# Patient Record
Sex: Female | Born: 1970 | Race: White | Hispanic: No | Marital: Married | Smoking: Former smoker
Health system: Southern US, Community
[De-identification: ages and names within clinical notes are randomized; demographics above are authoritative.]

## PROBLEM LIST (undated history)

## (undated) DIAGNOSIS — R002 Palpitations: Secondary | ICD-10-CM

## (undated) DIAGNOSIS — I10 Essential (primary) hypertension: Secondary | ICD-10-CM

## (undated) DIAGNOSIS — I77810 Thoracic aortic ectasia: Secondary | ICD-10-CM

## (undated) HISTORY — DX: Palpitations: R00.2

## (undated) HISTORY — DX: Thoracic aortic ectasia: I77.810

## (undated) HISTORY — PX: TUBAL LIGATION: SHX77

## (undated) HISTORY — DX: Essential (primary) hypertension: I10

---

## 2015-07-29 ENCOUNTER — Other Ambulatory Visit: Payer: Self-pay | Admitting: Osteopathic Medicine

## 2015-07-29 ENCOUNTER — Encounter: Payer: Self-pay | Admitting: Osteopathic Medicine

## 2015-07-29 ENCOUNTER — Ambulatory Visit (INDEPENDENT_AMBULATORY_CARE_PROVIDER_SITE_OTHER): Payer: Managed Care, Other (non HMO) | Admitting: Osteopathic Medicine

## 2015-07-29 VITALS — BP 125/85 | HR 77 | Ht 63.0 in | Wt 157.0 lb

## 2015-07-29 DIAGNOSIS — I10 Essential (primary) hypertension: Secondary | ICD-10-CM

## 2015-07-29 DIAGNOSIS — Z98891 History of uterine scar from previous surgery: Secondary | ICD-10-CM | POA: Insufficient documentation

## 2015-07-29 DIAGNOSIS — R7301 Impaired fasting glucose: Secondary | ICD-10-CM

## 2015-07-29 DIAGNOSIS — R002 Palpitations: Secondary | ICD-10-CM | POA: Diagnosis not present

## 2015-07-29 DIAGNOSIS — Z87891 Personal history of nicotine dependence: Secondary | ICD-10-CM

## 2015-07-29 DIAGNOSIS — R5382 Chronic fatigue, unspecified: Secondary | ICD-10-CM

## 2015-07-29 DIAGNOSIS — Z9851 Tubal ligation status: Secondary | ICD-10-CM | POA: Insufficient documentation

## 2015-07-29 MED ORDER — AMLODIPINE BESYLATE 5 MG PO TABS: 7.5000 mg | ORAL_TABLET | Freq: Every day | ORAL | Status: DC

## 2015-07-29 NOTE — Patient Instructions (Addendum)
We ask our patient with high blood pressure to follow-up every 6 months to monitor your blood pressure and manage your prescriptions. Please don't hesitate to make an appointment sooner if you're having any acute concerns or problems! Please bring your home blood pressure monitor to your next visit so we can verify it with our equipment in the office to ensure your home measurements are accurate. Please let your pharmacy know when you are running low on medications/refills (do not wait until you are out of medicines). Your pharmacy will send our office a request for the appropriate medications. Please allow our office 2-3 business days to process the needed refills.   At any visits to any specialists, or if you receive any vaccines anywhere other than our office, please give them our clinic information so that they can forward us any records, including any tests which are done or changes to your medications. This allows all your physicians to communicate effectively, putting your primary care doctor at the center of your medical care and allowing us to effectively coordinate your care.   Depending on lab results and on your palpitation symptoms, we will want to consider further tests such as Holter monitoring or Echocardiogram (ultrasound of the heart), and we may ask you to come in for an office visit to discuss significant results or to further talk about other causes of palpitations such as anxiety. Please alert us right away to any concerns you may have or if there are any changes with your symptoms, and please seek care immediately for any of the symptoms highlighted below! If you do not hear back about your lab results 1 - 2 days after you have your blood drawn, please call our office!   Let's plan to follow-up here in the office in 6 months to recheck blood pressure and to dedicate time to your annual wellness/preventive care visit.   Please let us know if there is anything else we can do for you.  Take care! -Dr. Mervyn SkeetersA.     Palpitations A palpitation is the feeling that your heartbeat is irregular or is faster than normal. It may feel like your heart is fluttering or skipping a beat. Palpitations are usually not a serious problem. However, in some cases, you may need further medical evaluation. CAUSES  Palpitations can be caused by:  Smoking.  Caffeine or other stimulants, such as diet pills or energy drinks.  Alcohol.  Stress and anxiety.  Strenuous physical activity.  Fatigue.  Certain medicines.  Heart disease, especially if you have a history of irregular heart rhythms (arrhythmias), such as atrial fibrillation, atrial flutter, or supraventricular tachycardia.  An improperly working pacemaker or defibrillator. DIAGNOSIS  To find the cause of your palpitations, your health care provider will take your medical history and perform a physical exam. Your health care provider may also have you take a test called an ambulatory electrocardiogram (ECG). An ECG records your heartbeat patterns over a 24-hour period. You may also have other tests, such as:  Transthoracic echocardiogram (TTE). During echocardiography, sound waves are used to evaluate how blood flows through your heart.  Transesophageal echocardiogram (TEE).  Cardiac monitoring. This allows your health care provider to monitor your heart rate and rhythm in real time.  Holter monitor. This is a portable device that records your heartbeat and can help diagnose heart arrhythmias. It allows your health care provider to track your heart activity for several days, if needed.  Stress tests by exercise or by giving medicine  that makes the heart beat faster. TREATMENT  Treatment of palpitations depends on the cause of your symptoms and can vary greatly. Most cases of palpitations do not require any treatment other than time, relaxation, and monitoring your symptoms. Other causes, such as atrial fibrillation, atrial flutter,  or supraventricular tachycardia, usually require further treatment. HOME CARE INSTRUCTIONS   Avoid:  Caffeinated coffee, tea, soft drinks, diet pills, and energy drinks.  Chocolate.  Alcohol.  Stop smoking if you smoke.  Reduce your stress and anxiety. Things that can help you relax include:  A method of controlling things in your body, such as your heartbeats, with your mind (biofeedback).  Yoga.  Meditation.  Physical activity such as swimming, jogging, or walking.  Get plenty of rest and sleep. SEEK MEDICAL CARE IF:   You continue to have a fast or irregular heartbeat beyond 24 hours.  Your palpitations occur more often. SEEK IMMEDIATE MEDICAL CARE IF:  You have chest pain or shortness of breath.  You have a severe headache.  You feel dizzy or you faint. MAKE SURE YOU:  Understand these instructions.  Will watch your condition.  Will get help right away if you are not doing well or get worse.   This information is not intended to replace advice given to you by your health care provider. Make sure you discuss any questions you have with your health care provider.   Document Released: 02/12/2000 Document Revised: 02/19/2013 Document Reviewed: 04/15/2011 Elsevier Interactive Patient Education [employer]

## 2015-07-29 NOTE — Progress Notes (Signed)
HPI: Taylor Lloyd is a 45 y.o. female who presents to [facility] today for chief complaint of:  Chief Complaint  Patient presents with  . Establish Care  . Hypertension     HYPERTENSION - Home monitor wrist, about less than a year old. Gx Preeclampsia last pregnancy. No CP/SOB.   PALPITATIONS - Sister with palpitations and hx mitral valve prolapse. She has occasional palpitations, notices more at nighttime, no dizziness/lightheaded, no chest pain/SOB. Concerns for anxiety.   FATIGUE - some trouble sleeping, has 78-year old at home, tired all the time.    Past medical, social and family history reviewed: History reviewed. No pertinent past medical history. History reviewed. No pertinent past surgical history. Social History  Substance Use Topics  . Smoking status: Former Games developer  . Smokeless tobacco: Not on file  . Alcohol Use: Not on file   History reviewed. No pertinent family history.  Current Outpatient Prescriptions  Medication Sig Dispense Refill  . amLODipine (NORVASC) 5 MG tablet Take 5 mg by mouth daily. Patient takes 7.5 mg daily     No current facility-administered medications for this visit.   No Known Allergies    Review of Systems: CONSTITUTIONAL:  No  fever, no chills, No  unintentional weight changes HEAD/EYES/EARS/NOSE/THROAT: No  headache, no vision change, no hearing change, No  sore throat, No  sinus pressure CARDIAC: No  chest pain, No  pressure, (+) palpitations, No  orthopnea RESPIRATORY: No  cough, No  shortness of breath/wheeze GASTROINTESTINAL: No  nausea, No  vomiting, No  abdominal pain, No  blood in stool, No  diarrhea, No  constipation  MUSCULOSKELETAL: No  myalgia/arthralgia GENITOURINARY: No  incontinence, No  abnormal genital bleeding/discharge SKIN: No  rash/wounds/concerning lesions HEM/ONC: No  easy bruising/bleeding, No  abnormal lymph node ENDOCRINE: No polyuria/polydipsia/polyphagia, No  heat/cold  intolerance  NEUROLOGIC: No  weakness, No  dizziness, No  slurred speech PSYCHIATRIC: No  concerns with depression, No  concerns with anxiety, No sleep problems  Exam:  BP 125/85 mmHg  Pulse 77  Ht  (1.6 m)  Wt 157 lb (71.215 kg)  BMI 27.82 kg/m2 Constitutional: VS see above. General Appearance: alert, well-developed, well-nourished, NAD Eyes: Normal lids and conjunctive, non-icteric sclera, PERRLA Ears, Nose, Mouth, Throat: MMM, Normal external inspection ears/nares/mouth/lips/gums, TM normal bilaterally. Pharynx no erythema, no exudate. Normal nasal mucosa Neck: No masses, trachea midline. No thyroid enlargement/tenderness/mass appreciated. No lymphadenopathy Respiratory: Normal respiratory effort. no wheeze, no rhonchi, no rales Cardiovascular: S1/S2 normal, no murmur, no rub/gallop auscultated. RRR. No JVD. No abdominal aortic bruit. No lower extremity edema. Gastrointestinal: Nontender, no masses. No hepatomegaly, no splenomegaly. No hernia appreciated. Bowel sounds normal. Rectal exam deferred.  Musculoskeletal: Gait normal. No clubbing/cyanosis of digits.  Neurological: No cranial nerve deficit on limited exam. Motor and sensation intact and symmetric Skin: warm, dry, intact. No rash/ulcer. No concerning nevi or subq nodules on limited exam.   Psychiatric: Normal judgment/insight. Normal mood and affect. Oriented x3.    No results found for this or any previous visit (from the past 72 hour(s)).  EKG interpretation: Rate: 76 Rhythm: sinus No ST/T changes concerning for acute ischemia/infarct     ASSESSMENT/PLAN: See patient instructions, EKG ok, consider Holter/Echo depending on results. Labs as below to get fasting tomorrow AM.   Essential hypertension - Plan: CBC with Differential/Platelet, COMPLETE METABOLIC PANEL WITH GFR, Lipid panel, amLODipine (NORVASC) 5 MG tablet  Palpitations - Plan: CBC with Differential/Platelet, COMPLETE METABOLIC PANEL WITH  GFR, TSH,  Magnesium, Urinalysis, EKG 12-Lead  S/P C-section  Former smoker  S/P tubal ligation  Chronic fatigue - Plan: CBC with Differential/Platelet, TSH, VITAMIN D 25 Hydroxy (Vit-D Deficiency, Fractures)     All questions were answered. Visit summary with updated medication list and pertinent instructions was printed for patient. ER/RTC precautions were reviewed with the patient. Return in about 6 months (around 01/28/2016), or sooner if needed and depending on results/symptoms, for HYPERTENSION RECHECK Fenton Malling/ANNUAL WELLNESS.

## 2015-07-30 LAB — LIPID PANEL
CHOL/HDL RATIO: 2 ratio (ref <=5.0)
CHOLESTEROL: 137 mg/dL (ref 125–200)
HDL: 68 mg/dL (ref >=46)
LDL CALC: 55 mg/dL (ref <130)
Triglycerides: 72 mg/dL (ref <150)
VLDL: 14 mg/dL (ref <30)

## 2015-07-30 LAB — CBC WITH DIFFERENTIAL/PLATELET
BASOS PCT: 0 %
Basophils Absolute: 0 cells/uL (ref 0–200)
EOS PCT: 4 %
Eosinophils Absolute: 276 cells/uL (ref 15–500)
HCT: 40.4 % (ref 35.0–45.0)
Hemoglobin: 13.7 g/dL (ref 11.7–15.5)
LYMPHS PCT: 32 %
Lymphs Abs: 2208 cells/uL (ref 850–3900)
MCH: 32.8 pg (ref 27.0–33.0)
MCHC: 33.9 g/dL (ref 32.0–36.0)
MCV: 96.7 fL (ref 80.0–100.0)
MONO ABS: 690 {cells}/uL (ref 200–950)
MONOS PCT: 10 %
MPV: 8.9 fL (ref 7.5–12.5)
NEUTROS PCT: 54 %
Neutro Abs: 3726 cells/uL (ref 1500–7800)
PLATELETS: 387 10*3/uL (ref 140–400)
RBC: 4.18 MIL/uL (ref 3.80–5.10)
RDW: 13.3 % (ref 11.0–15.0)
WBC: 6.9 10*3/uL (ref 3.8–10.8)

## 2015-07-30 LAB — COMPLETE METABOLIC PANEL WITH GFR
ALT: 17 U/L (ref 6–29)
AST: 18 U/L (ref 10–35)
Albumin: 4.1 g/dL (ref 3.6–5.1)
Alkaline Phosphatase: 45 U/L (ref 33–115)
BUN: 6 mg/dL — AB (ref 7–25)
CALCIUM: 9.3 mg/dL (ref 8.6–10.2)
CHLORIDE: 106 mmol/L (ref 98–110)
CO2: 23 mmol/L (ref 20–31)
CREATININE: 0.62 mg/dL (ref 0.50–1.10)
GFR, Est African American: >89 mL/min (ref >=60)
GFR, Est Non African American: >89 mL/min (ref >=60)
Glucose, Bld: 101 mg/dL — ABNORMAL HIGH (ref 65–99)
POTASSIUM: 4.3 mmol/L (ref 3.5–5.3)
SODIUM: 137 mmol/L (ref 135–146)
Total Bilirubin: 0.7 mg/dL (ref 0.2–1.2)
Total Protein: 6.4 g/dL (ref 6.1–8.1)

## 2015-07-30 LAB — TSH: TSH: 1.37 mIU/L

## 2015-07-30 LAB — MAGNESIUM: MAGNESIUM: 1.9 mg/dL (ref 1.5–2.5)

## 2015-07-31 LAB — URINALYSIS
BILIRUBIN URINE: NEGATIVE
GLUCOSE, UA: NEGATIVE
HGB URINE DIPSTICK: NEGATIVE
Ketones, ur: NEGATIVE
LEUKOCYTES UA: NEGATIVE
Nitrite: NEGATIVE
PH: 7 (ref 5.0–8.0)
Protein, ur: NEGATIVE
Specific Gravity, Urine: 1.013 (ref 1.001–1.035)

## 2015-07-31 LAB — HEMOGLOBIN A1C
HEMOGLOBIN A1C: 5.2 % (ref <5.7)
Mean Plasma Glucose: 103 mg/dL

## 2015-07-31 LAB — VITAMIN D 25 HYDROXY (VIT D DEFICIENCY, FRACTURES): VIT D 25 HYDROXY: 36 ng/mL (ref 30–100)

## 2015-07-31 NOTE — Addendum Note (Signed)
Addended by: Deirdre PippinsALEXANDER, Castor Gittleman M on: 07/31/2015 08:28 AM   Modules accepted: Orders

## 2015-08-03 NOTE — Addendum Note (Signed)
Addended by: Deirdre PippinsALEXANDER, Karalina Tift M on: 08/03/2015 09:51 AM   Modules accepted: Orders

## 2016-01-27 ENCOUNTER — Ambulatory Visit (INDEPENDENT_AMBULATORY_CARE_PROVIDER_SITE_OTHER): Payer: Managed Care, Other (non HMO) | Admitting: Osteopathic Medicine

## 2016-01-27 ENCOUNTER — Encounter: Payer: Self-pay | Admitting: Osteopathic Medicine

## 2016-01-27 VITALS — BP 144/98 | HR 72 | Ht 63.0 in | Wt 166.0 lb

## 2016-01-27 DIAGNOSIS — R002 Palpitations: Secondary | ICD-10-CM

## 2016-01-27 DIAGNOSIS — I10 Essential (primary) hypertension: Secondary | ICD-10-CM

## 2016-01-27 MED ORDER — AMLODIPINE BESYLATE 5 MG PO TABS: 7.5000 mg | ORAL_TABLET | Freq: Every day | ORAL | 3 refills | Status: DC

## 2016-01-27 NOTE — Patient Instructions (Addendum)
Please call if you haven't heard back about Holter, Echocardiogram and cardiology referrals.

## 2016-01-27 NOTE — Progress Notes (Signed)
HPI: Taylor Lloyd is a 45 y.o. female  who [facility] [place] [facility], 01/27/16,  for chief complaint of:  Chief Complaint  Patient presents with  . Follow-up    blood pressure    Hypertension: No chest pain, pressure, shortness of breath.  Palpitations: Have persisted on and off, we had planned to get echocardiogram/Holter monitor, patient did not call us when she never heard back about scheduling these procedures. Still having heart racing episodes lasting a few minutes several times per week. States previous evaluation by cardiology but not sure if it was for palpitations, felt like she had a stress test as well as echocardiogram, EKG. No records are available  Past medical, surgical, social and family history reviewed: Patient Active Problem List   Diagnosis Date Noted  . Essential hypertension 07/29/2015  . S/P C-section 07/29/2015  . Former smoker 07/29/2015  . S/P tubal ligation 07/29/2015   Past Surgical History:  Procedure Laterality Date  . CESAREAN SECTION     [phone],1997,209,2015   Social History  Substance Use Topics  . Smoking status: Former Games developermoker  . Smokeless tobacco: Not on file  . Alcohol use Not on file   Family History  Problem Relation Age of Onset  . Hypertension Mother   . Cancer Father     lung  . Hyperlipidemia Father   . Hypertension Father   . Diabetes Maternal Grandmother   .        Current medication list and allergy/intolerance information reviewed:   Current Outpatient Prescriptions on File Prior to Visit  Medication Sig Dispense Refill  . amLODipine (NORVASC) 5 MG tablet Take 1.5 tablets (7.5 mg total) by mouth daily. Patient takes 7.5 mg daily 45 tablet 6   No current facility-administered medications on file prior to visit.    Allergies  Allergen Reactions  . Motrin [Ibuprofen] Hives      Review of Systems:  Constitutional: No recent illness  HEENT: No  headache, no vision  change  Cardiac: No  chest pain, No  pressure, +palpitations  Respiratory:  No  shortness of breath. No  Cough  Neurologic: No  weakness, No  Dizziness  Psychiatric: No  concerns with depression, No  concerns with anxiety  Exam:  BP (!) 144/98   Pulse 72   Ht 5\' 3"  (1.6 m)   Wt 166 lb (75.3 kg)   BMI 29.41 kg/m   Constitutional: VS see above. General Appearance: alert, well-developed, well-nourished, NAD  Eyes: Normal lids and conjunctive, non-icteric sclera  Ears, Nose, Mouth, Throat: MMM, Normal external inspection ears/nares/mouth/lips/gums.  Neck: No masses, trachea midline.   Respiratory: Normal respiratory effort. no wheeze, no rhonchi, no rales  Cardiovascular: S1/S2 normal, no murmur, no rub/gallop auscultated. RRR.   Musculoskeletal: Gait normal. Symmetric and independent movement of all extremities  Neurological: Normal balance/coordination. No tremor.  Skin: warm, dry, intact.   Psychiatric: Normal judgment/insight. Normal mood and affect. Oriented x3.      ASSESSMENT/PLAN:   Palpitations - Plan: Holter monitor - 48 hour, ECHOCARDIOGRAM COMPLETE  Essential hypertension - Plan: amLODipine (NORVASC) 5 MG tablet  Annual physical exam   FEMALE PREVENTIVE CARE Updated 01/27/16   ANNUAL SCREENING/COUNSELING  Diet/Exercise - HEALTHY HABITS DISCUSSED TO DECREASE CV RISK History  Smoking Status  . Former Smoker  Smokeless Tobacco  . Not on file    History  Alcohol use Not on file    [facility] 01/28/2016  Decreased Interest 0  Down, Depressed, Hopeless 0  PHQ - 2 Score 0    Domestic violence concerns - no  HTN SCREENING - SEE VITALS  SEXUAL HEALTH  Sexually active in the past year - Yes with female.  Need/want STI testing today? - no  Concerns about libido or pain with sex? - no  Plans for pregnancy? - btl  INFECTIOUS DISEASE SCREENING  HIV - does not need  GC/CT - does not need  HepC - DOB [birth year] - does not  need  TB - does not need  DISEASE SCREENING  Lipid - does not need  DM2 - does not need  Osteoporosis - women age 1+ - does not need  CANCER SCREENING  Cervical - does not need  Breast - does not need  Lung - does not need  Colon - does not need  ADULT VACCINATION  Influenza - annual vaccine recommended  Td - booster every 10 years   Zoster - option at 50, yes at 60+   PCV13 - was not indicated  PPSV23 - was not indicated  There is no immunization history on file for this patient.   OTHER  Fall - exercise and Vit D age 1+ - does not need  Consider ASA - age 46-59 - does not need     Patient Instructions  Please call if you haven't heard back about Holter, Echocardiogram and cardiology referrals.       Visit summary with medication list and pertinent instructions WAS printed for patient to review. All questions at time of visit were answered - patient instructed to contact office with any additional concerns. ER/RTC precautions were reviewed with the patient. Follow-up plan: Return in about 3 months (around 04/27/2016) for recheck BP.

## 2016-01-28 DIAGNOSIS — R002 Palpitations: Secondary | ICD-10-CM | POA: Insufficient documentation

## 2016-02-01 NOTE — Progress Notes (Signed)
Referring: Sunnie NielsenAlexander, Natalie   HPI: 45 year old female for evaluation of palpitations. TSH May 2017 1.37. Patient has had intermittent palpitations for approximately 1-1/2 years. They are described as her heart racing. It typically occurs at night. No associated dizziness, syncope, chest pain or dyspnea. Resolves spontaneously. She has mild dyspnea on exertion but no orthopnea or PND. She does have pedal edema. There is no significant exertional chest pain. Because of the above we were asked to evaluate.  Current Outpatient Prescriptions  Medication Sig Dispense Refill  . amLODipine (NORVASC) 5 MG tablet Take 1.5 tablets (7.5 mg total) by mouth daily. Patient takes 7.5 mg daily 135 tablet 3  . Multiple Vitamin (MULTIVITAMIN) tablet Take 1 tablet by mouth daily.     No current facility-administered medications for this visit.     Allergies  Allergen Reactions  . Motrin [Ibuprofen] Hives     Past Medical History:  Diagnosis Date  . Hypertension   . Palpitations     Past Surgical History:  Procedure Laterality Date  . CESAREAN SECTION     [phone],1997,209,2015  . TUBAL LIGATION      Social History   Social History  . Marital status: Married    Spouse name: N/A  . Number of children: 7  . Years of education: N/A   Occupational History  . Not on file.   Social History Main Topics  . Smoking status: Former Games developermoker  . Smokeless tobacco: Never Used  . Alcohol use 0.0 oz/week     Comment: 2 glasses wine per week  . Drug use: Unknown  . Sexual activity: Not on file   Other Topics Concern  . Not on file   Social History Narrative  . No narrative on file    Family History  Problem Relation Age of Onset  . Hypertension Mother   . Cancer Father     lung  . Hyperlipidemia Father   . Hypertension Father   . CAD Father   . Diabetes Maternal Grandmother   . CAD Brother     ROS: no fevers or chills, productive cough, hemoptysis, dysphasia, odynophagia, melena,  hematochezia, dysuria, hematuria, rash, seizure activity, orthopnea, PND, claudication. Remaining systems are negative.  Physical Exam:   Blood pressure 132/81, pulse 74, height 5\' 3"  (1.6 m), weight 166 lb (75.3 kg).  General:  Well developed/well nourished in NAD Skin warm/dry Patient not depressed No peripheral clubbing Back-normal HEENT-normal/normal eyelids Neck supple/normal carotid upstroke bilaterally; no bruits; no JVD; no thyromegaly chest - CTA/ normal expansion CV - RRR/normal S1 and S2; no rubs or gallops;  PMI nondisplaced; 1/6 systolic murmur apex. Abdomen -NT/ND, no HSM, no mass, + bowel sounds, no bruit 2+ femoral pulses, no bruits Ext-no edema, chords, 2+ DP Neuro-grossly nonfocal  ECG -Sinus rhythm at a rate of 74. No ST changes.  A/P  1 palpitations-etiology unclear. Schedule echocardiogram to assess LV function. We discussed a monitored today but she is allergic to the leads. I therefore recommended [employer]. We will also add a beta blocker.  2 hypertension-she does have some pedal edema with amlodipine. I will discontinue this and treat her high blood pressure and palpitations with beta-blockade. Begin Toprol 50 mg daily.  3 tobacco abuse-agent counseled on discontinuing.  4 family history of coronary disease-She has a brother who was diagnosed with a proximal LAD lesion by calcium score. She is concerned about her family history and has requested this. We will arrange a calcium score.  Olga MillersBrian Crenshaw, MD

## 2016-02-03 ENCOUNTER — Encounter: Payer: Self-pay | Admitting: Cardiology

## 2016-02-03 ENCOUNTER — Ambulatory Visit (INDEPENDENT_AMBULATORY_CARE_PROVIDER_SITE_OTHER): Payer: Managed Care, Other (non HMO) | Admitting: Cardiology

## 2016-02-03 VITALS — BP 132/81 | HR 74 | Ht 63.0 in | Wt 166.0 lb

## 2016-02-03 DIAGNOSIS — R002 Palpitations: Secondary | ICD-10-CM

## 2016-02-03 DIAGNOSIS — R072 Precordial pain: Secondary | ICD-10-CM | POA: Diagnosis not present

## 2016-02-03 DIAGNOSIS — I10 Essential (primary) hypertension: Secondary | ICD-10-CM

## 2016-02-03 MED ORDER — METOPROLOL SUCCINATE ER 25 MG PO TB24: 25.0000 mg | ORAL_TABLET | Freq: Every day | ORAL | 3 refills | Status: DC

## 2016-02-03 NOTE — Patient Instructions (Signed)
Medication Instructions:   STOP AMLODIPINE  START METOPROLOL SUCC ER 50 MG ONCE DAILY AT BEDTIME  Testing/Procedures:  CT CARDIAC CALCIUM SCORE AT THE [place] OFFICE  Follow-Up:  Your physician recommends that you schedule a follow-up appointment in: 8-12 WEEKS WITH DR Jens SomRENSHAW   ALIVECOR TO EVALUATE HEART RHYTHM ON YOUR PHONE

## 2016-02-04 ENCOUNTER — Telehealth: Payer: Self-pay | Admitting: Cardiology

## 2016-02-04 ENCOUNTER — Ambulatory Visit (HOSPITAL_BASED_OUTPATIENT_CLINIC_OR_DEPARTMENT_OTHER): Payer: Managed Care, Other (non HMO)

## 2016-02-04 ENCOUNTER — Ambulatory Visit (HOSPITAL_BASED_OUTPATIENT_CLINIC_OR_DEPARTMENT_OTHER)
Admission: RE | Admit: 2016-02-04 | Discharge: 2016-02-04 | Disposition: A | Discharge status: Home / Self Care | Payer: Managed Care, Other (non HMO) | Source: Ambulatory Visit | Attending: Osteopathic Medicine | Admitting: Osteopathic Medicine

## 2016-02-04 DIAGNOSIS — R002 Palpitations: Secondary | ICD-10-CM | POA: Diagnosis not present

## 2016-02-04 NOTE — Progress Notes (Signed)
  Echocardiogram 2D Echocardiogram has been performed.  Taylor SavoyCasey N Margaret Lloyd 02/04/2016, 10:13 AM

## 2016-02-04 NOTE — Telephone Encounter (Signed)
New message  Pt call requesting to speak with RN about Dr. Jens Somrenshaw reviewing her echo results and going over them with pt. Please call back to discuss

## 2016-02-04 NOTE — Telephone Encounter (Signed)
Pt notified of result turnaround time pt will await MD reading and call for results

## 2016-02-11 ENCOUNTER — Ambulatory Visit (INDEPENDENT_AMBULATORY_CARE_PROVIDER_SITE_OTHER)
Admission: RE | Admit: 2016-02-11 | Discharge: 2016-02-11 | Disposition: A | Discharge status: Home / Self Care | Payer: Self-pay | Source: Ambulatory Visit | Attending: Cardiology | Admitting: Cardiology

## 2016-02-11 DIAGNOSIS — R072 Precordial pain: Secondary | ICD-10-CM

## 2016-02-11 IMAGING — CT CT HEART SCORING
2 series · 16 of 20 positions shown, 18 images · non-contrast
Comparison: None.

CLINICAL DATA: Risk stratification

EXAM:
Coronary Calcium Score
TECHNIQUE: The patient was scanned on a Siemens Somatom 64 slice scanner. Axial
non-contrast 3mm slices were carried out through the heart. The data
set was analyzed on a dedicated work station and scored using the
Agatson method.

[Series 2: casc 3.0 i36f 2 bestdiast 65 % · axial · 0.35mm/px · z∈[-224,-125]mm · 8 of 43 slices shown, 10 images]
[im 5/43  vessel]
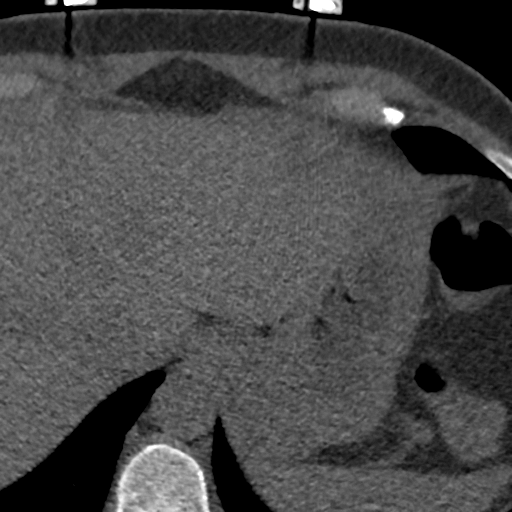
[im 5/43  lung]
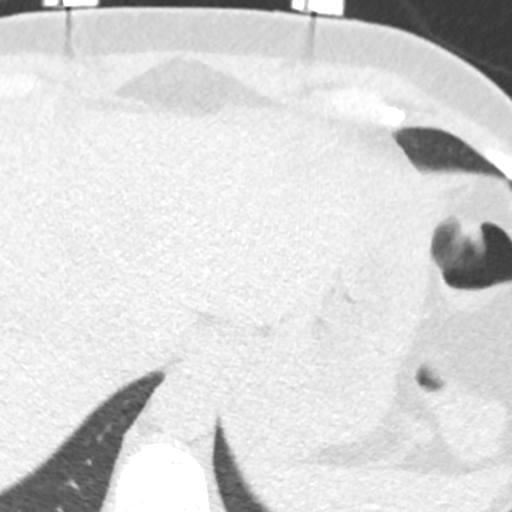
[im 10/43  vessel]
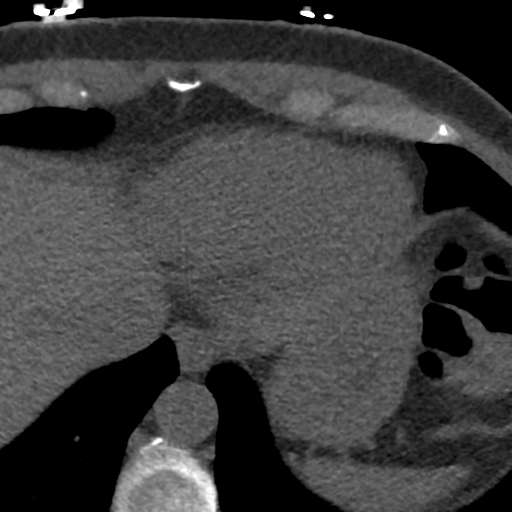
[im 15/43  vessel]
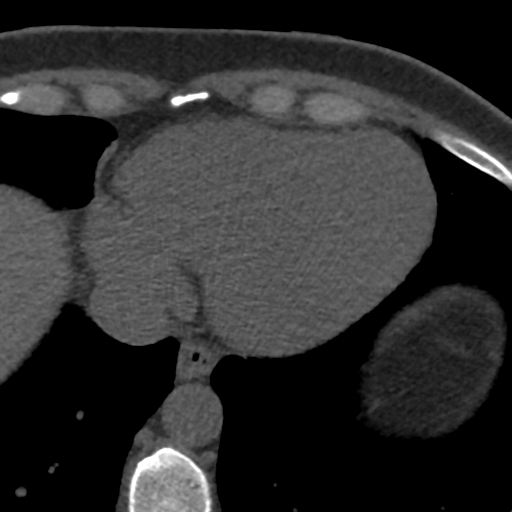
[im 19/43  vessel]
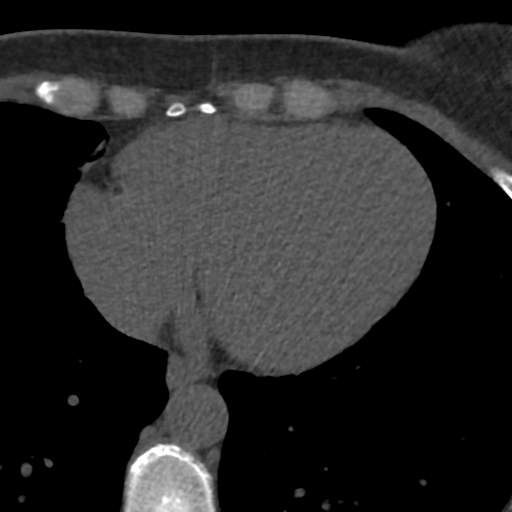
[im 24/43  vessel]
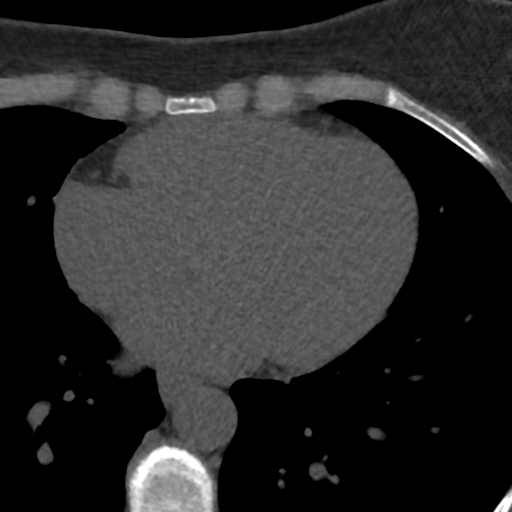
[im 24/43  lung]
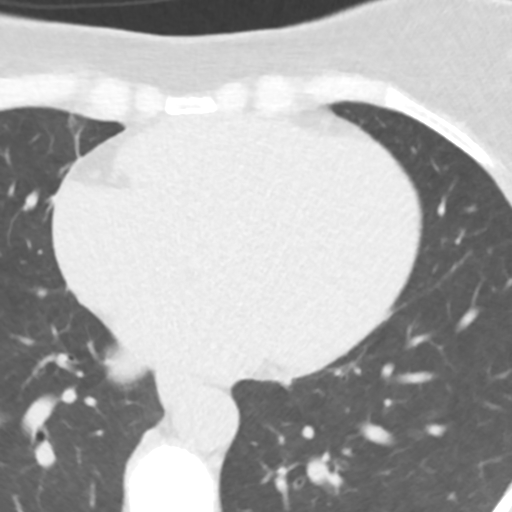
[im 29/43  vessel]
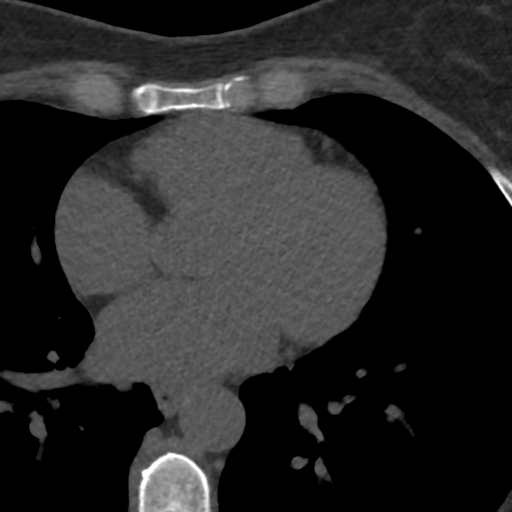
[im 33/43  vessel]
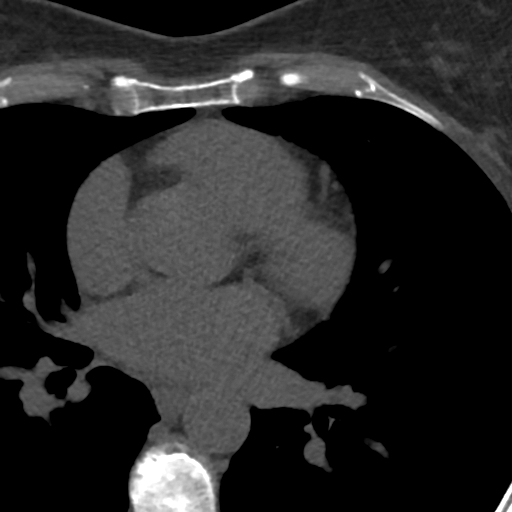
[im 38/43  vessel]
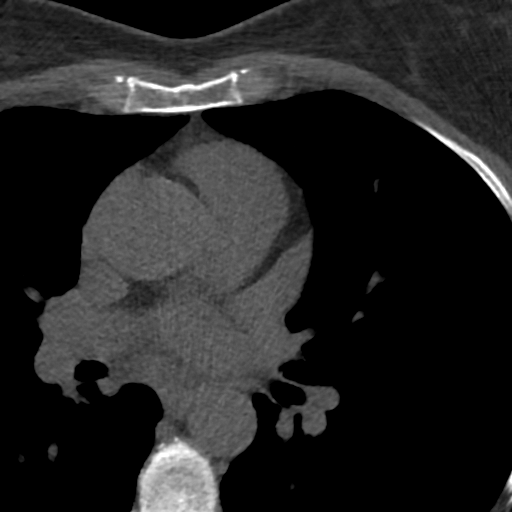

[Series 4: lung st 64 % · axial · 0.68mm/px · z∈[-224,-125]mm · 8 of 43 slices shown]
[im 5/43  lung]
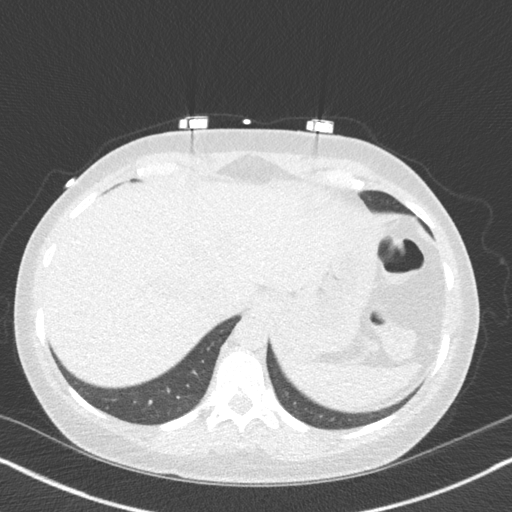
[im 10/43  lung]
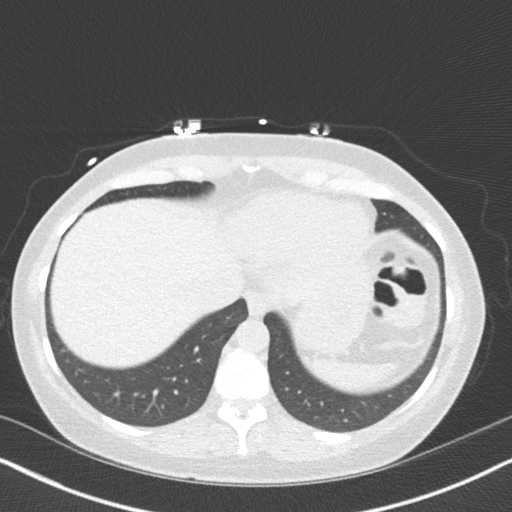
[im 15/43  lung]
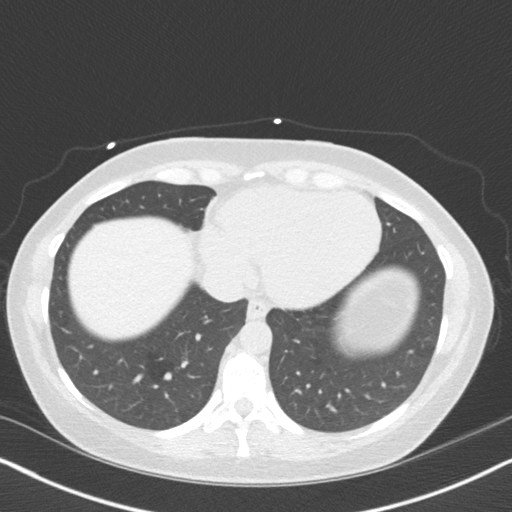
[im 19/43  lung]
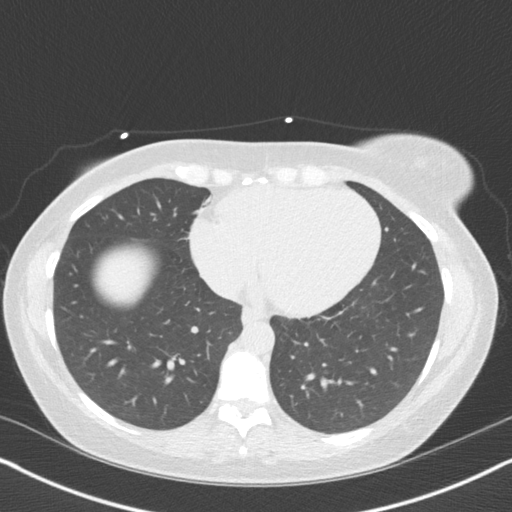
[im 24/43  lung]
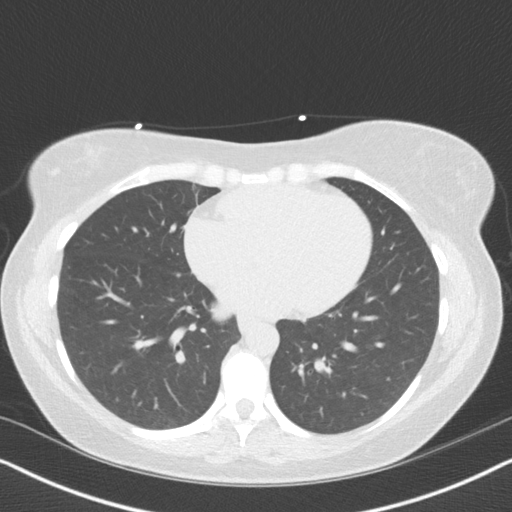
[im 29/43  lung]
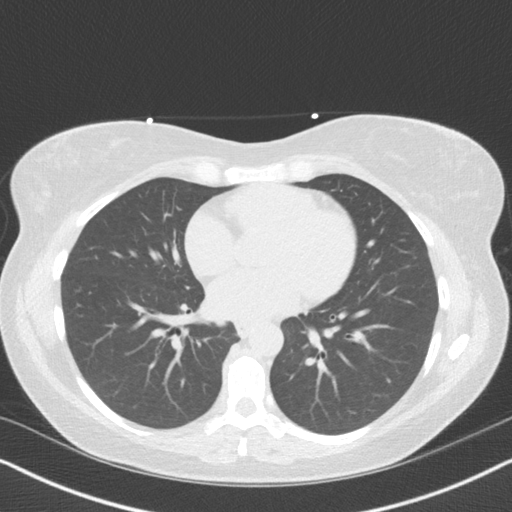
[im 33/43  lung]
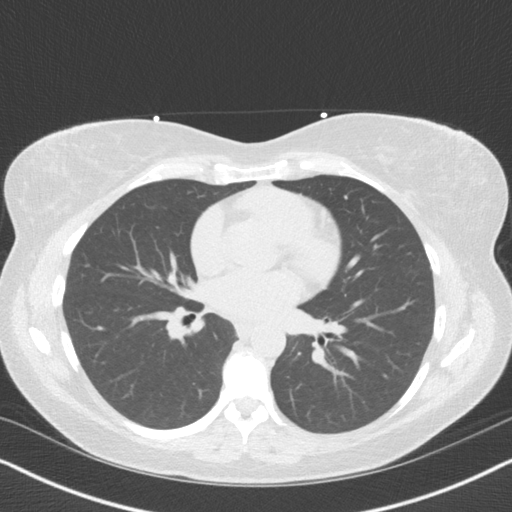
[im 38/43  lung]
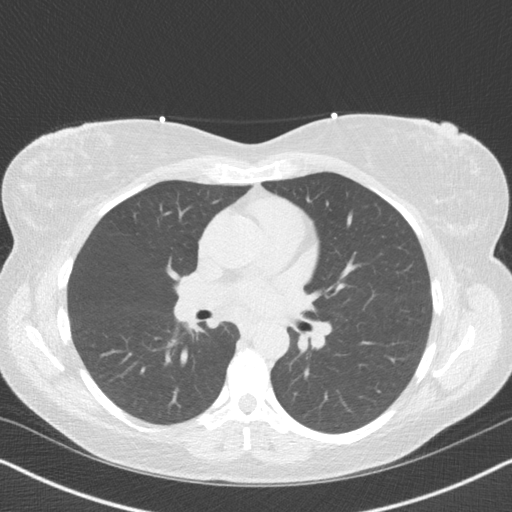

[16 of 20 positions shown; findings below may reference images not displayed]

FINDINGS: Non-cardiac: No significant non cardiac findings on limited lung and
soft tissue windows. See separate report from [REDACTED].

Ascending Aorta:  4.0 cm dilated

Pericardium: Normal

Coronary arteries:  No calcium detected
IMPRESSION: Coronary calcium score of 0. Dilated aortic root suggest imaging in
a year

JAZMINE

EXAM:
OVER-READ INTERPRETATION  CT CHEST

The following report is an over-read performed by radiologist Dr.
JAZMINE [REDACTED] on [DATE]. This over-read
does not include interpretation of cardiac or coronary anatomy or
pathology. The coronary calcium score interpretation by the
cardiologist is attached.
FINDINGS: Cardiovascular: Heart is normal size. Visualized aorta normal
caliber.

Mediastinum/Nodes: No adenopathy in the visualized lower mediastinum
or hila.

Lungs/Pleura: Lungs are clear. No focal airspace opacities or
suspicious nodules. No effusions.

Upper Abdomen: Imaging into the upper abdomen shows no acute
findings.

Musculoskeletal: Chest wall soft tissues are unremarkable. No acute
bony abnormality or focal bone lesion.
IMPRESSION: No acute or significant extracardiac abnormality.

## 2016-03-21 NOTE — Progress Notes (Signed)
      HPI: FU palpitations. TSH May 2017 1.37. Patient has had intermittent palpitations for approximately 1-1/2 years. Echocardiogram December 2017 showed normal LV function and trace mitral regurgitation. Calcium score December 2017 0. Aortic root 4 cm. Alivecor recommended at last ov. Since last seen, the patient denies any dyspnea on exertion, orthopnea, PND, pedal edema, syncope or chest pain. Patient's palpitations are much improved with Toprol and her pedal edema has resolved. She did record some strips with alivecors when she felt "strange" in her chest. This revealed sinus rhythm.    Current Outpatient Prescriptions  Medication Sig Dispense Refill  . metoprolol succinate (TOPROL XL) 25 MG 24 hr tablet Take 1 tablet (25 mg total) by mouth daily. 90 tablet 3  . Multiple Vitamin (MULTIVITAMIN) tablet Take 1 tablet by mouth daily.     No current facility-administered medications for this visit.      Past Medical History:  Diagnosis Date  . Hypertension   . Palpitations     Past Surgical History:  Procedure Laterality Date  . CESAREAN SECTION     [phone],1997,209,2015  . TUBAL LIGATION      Social History   Social History  . Marital status: Married    Spouse name: N/A  . Number of children: 7  . Years of education: N/A   Occupational History  . Not on file.   Social History Main Topics  . Smoking status: Former Games developermoker  . Smokeless tobacco: Never Used  . Alcohol use 0.0 oz/week     Comment: 2 glasses wine per week  . Drug use: Unknown  . Sexual activity: Not on file   Other Topics Concern  . Not on file   Social History Narrative  . No narrative on file    Family History  Problem Relation Age of Onset  . Hypertension Mother   . Cancer Father     lung  . Hyperlipidemia Father   . Hypertension Father   . CAD Father   . Diabetes Maternal Grandmother   . CAD Brother     ROS: no fevers or chills, productive cough, hemoptysis, dysphasia, odynophagia,  melena, hematochezia, dysuria, hematuria, rash, seizure activity, orthopnea, PND, pedal edema, claudication. Remaining systems are negative.  Physical Exam: Well-developed well-nourished in no acute distress.  Skin is warm and dry.  HEENT is normal.  Neck is supple. No bruits Chest is clear to auscultation with normal expansion.  Cardiovascular exam is regular rate and rhythm.  Abdominal exam nontender or distended. No masses palpated. Extremities show no edema. neuro grossly intact  A/P  1 hypertension-blood pressure controlled. Continue present medications.  2 palpitations-much improved with the addition of Toprol. Will continue. Pt brought alivecors strips for review today which revealed sinus.  3 tobacco abuse-patient counseled on discontinuing.  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  Olga MillersBrian Crenshaw, MD

## 2016-03-30 ENCOUNTER — Encounter: Payer: Self-pay | Admitting: Cardiology

## 2016-03-30 ENCOUNTER — Ambulatory Visit (INDEPENDENT_AMBULATORY_CARE_PROVIDER_SITE_OTHER): Payer: Managed Care, Other (non HMO) | Admitting: Cardiology

## 2016-03-30 VITALS — BP 117/80 | HR 85 | Ht 63.0 in | Wt 166.0 lb

## 2016-03-30 DIAGNOSIS — R002 Palpitations: Secondary | ICD-10-CM | POA: Diagnosis not present

## 2016-03-30 DIAGNOSIS — Z720 Tobacco use: Secondary | ICD-10-CM | POA: Diagnosis not present

## 2016-03-30 DIAGNOSIS — I10 Essential (primary) hypertension: Secondary | ICD-10-CM | POA: Diagnosis not present

## 2016-03-30 NOTE — Patient Instructions (Signed)
Your physician wants you to follow-up in: 6 MONTHS WITH DR CRENSHAW You will receive a reminder letter in the mail two months in advance. If you don't receive a letter, please call our office to schedule the follow-up appointment.   If you need a refill on your cardiac medications before your next appointment, please call your pharmacy.  

## 2016-04-20 ENCOUNTER — Encounter: Payer: Self-pay | Admitting: Osteopathic Medicine

## 2016-04-20 ENCOUNTER — Ambulatory Visit (INDEPENDENT_AMBULATORY_CARE_PROVIDER_SITE_OTHER): Payer: Managed Care, Other (non HMO)

## 2016-04-20 ENCOUNTER — Ambulatory Visit (INDEPENDENT_AMBULATORY_CARE_PROVIDER_SITE_OTHER): Payer: Managed Care, Other (non HMO) | Admitting: Family Medicine

## 2016-04-20 VITALS — BP 126/71 | HR 93 | Ht 63.0 in | Wt 166.0 lb

## 2016-04-20 DIAGNOSIS — M25562 Pain in left knee: Secondary | ICD-10-CM

## 2016-04-20 DIAGNOSIS — S39012A Strain of muscle, fascia and tendon of lower back, initial encounter: Secondary | ICD-10-CM | POA: Insufficient documentation

## 2016-04-20 IMAGING — DX DG KNEE COMPLETE 4+V*L*
4 series · 4 of 4 positions shown · non-contrast
Comparison: None.

CLINICAL DATA: Left knee pain for 4 days, no injury

EXAM:
LEFT KNEE - COMPLETE 4+ VIEW

[knee lat]
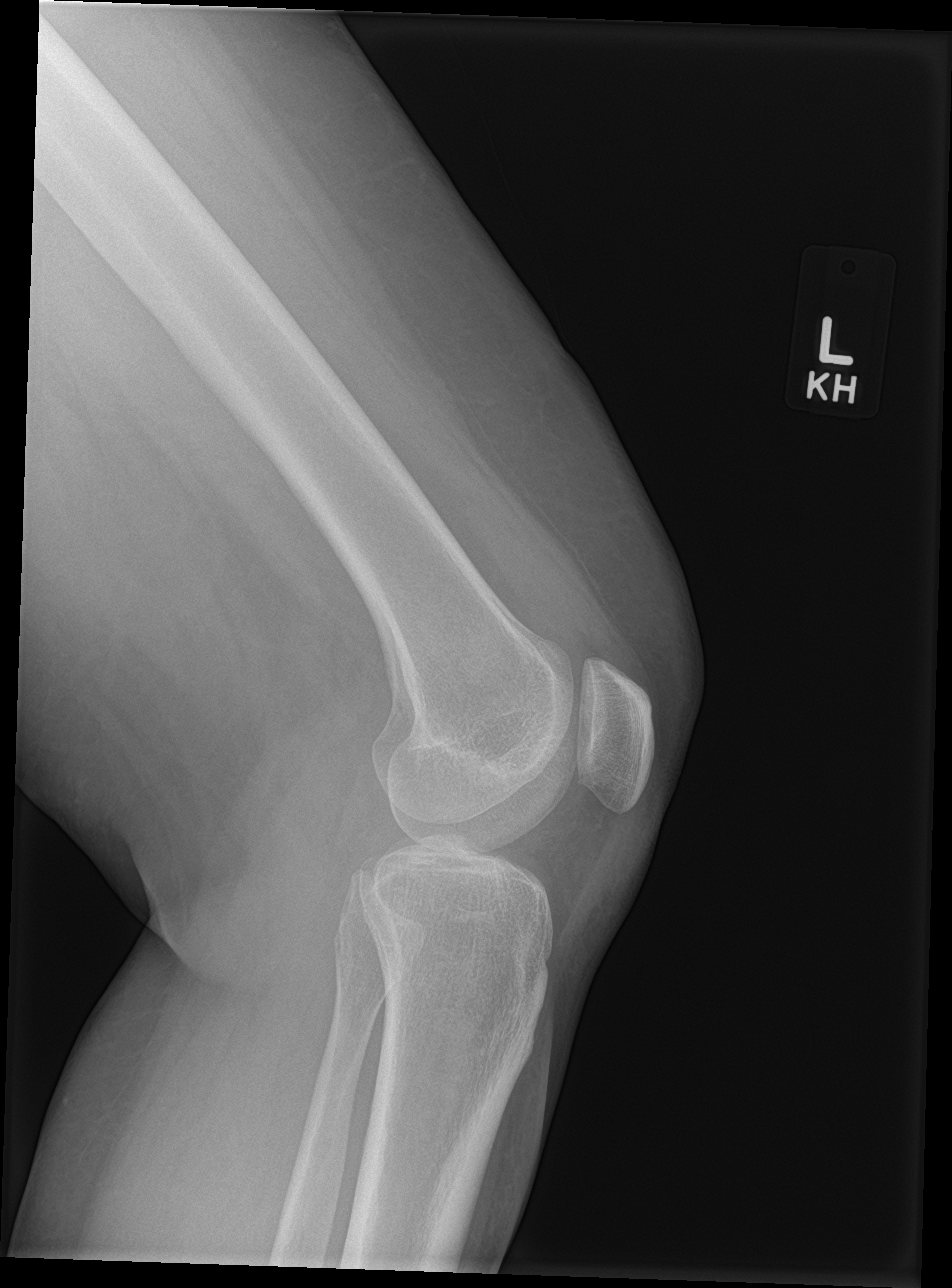

[knee sunrise]
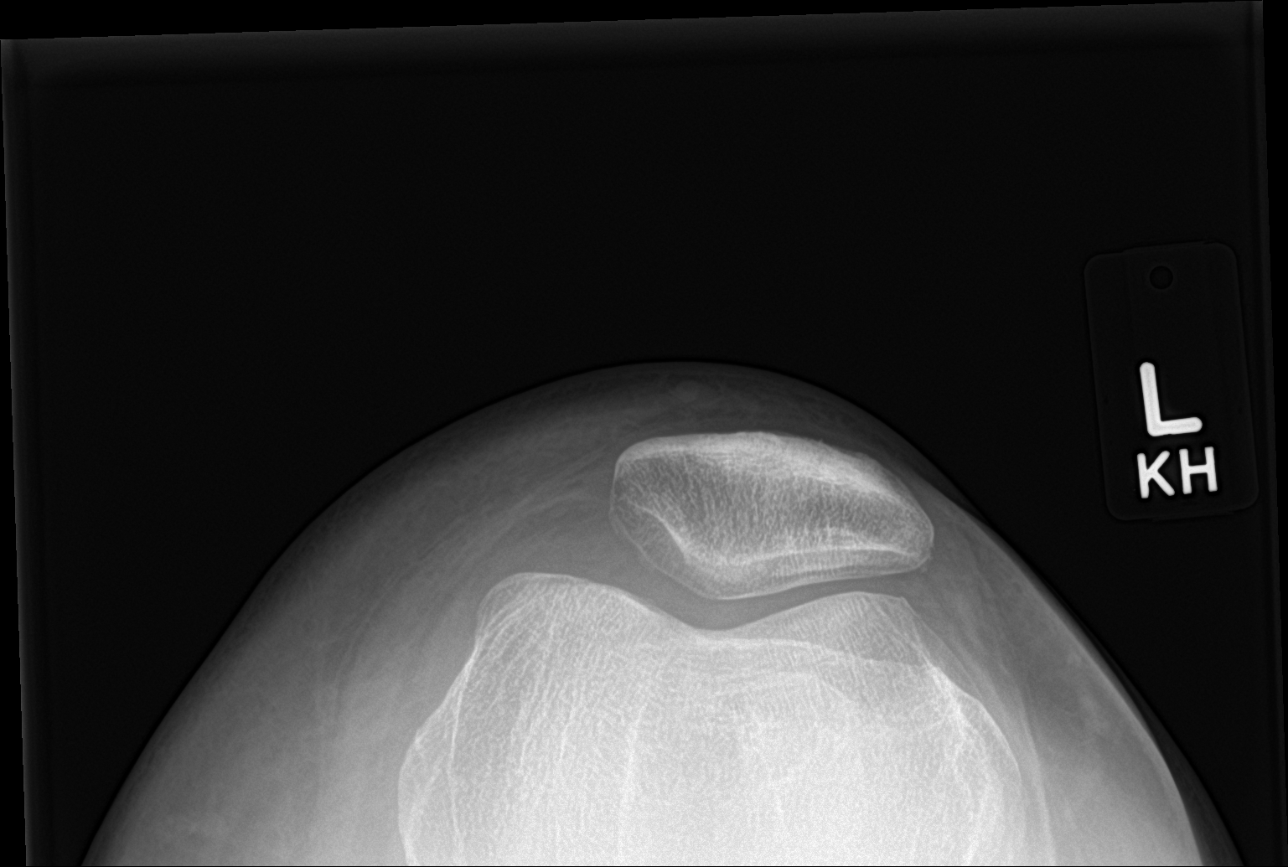

[knee ap bilat standing (1 of 2)]
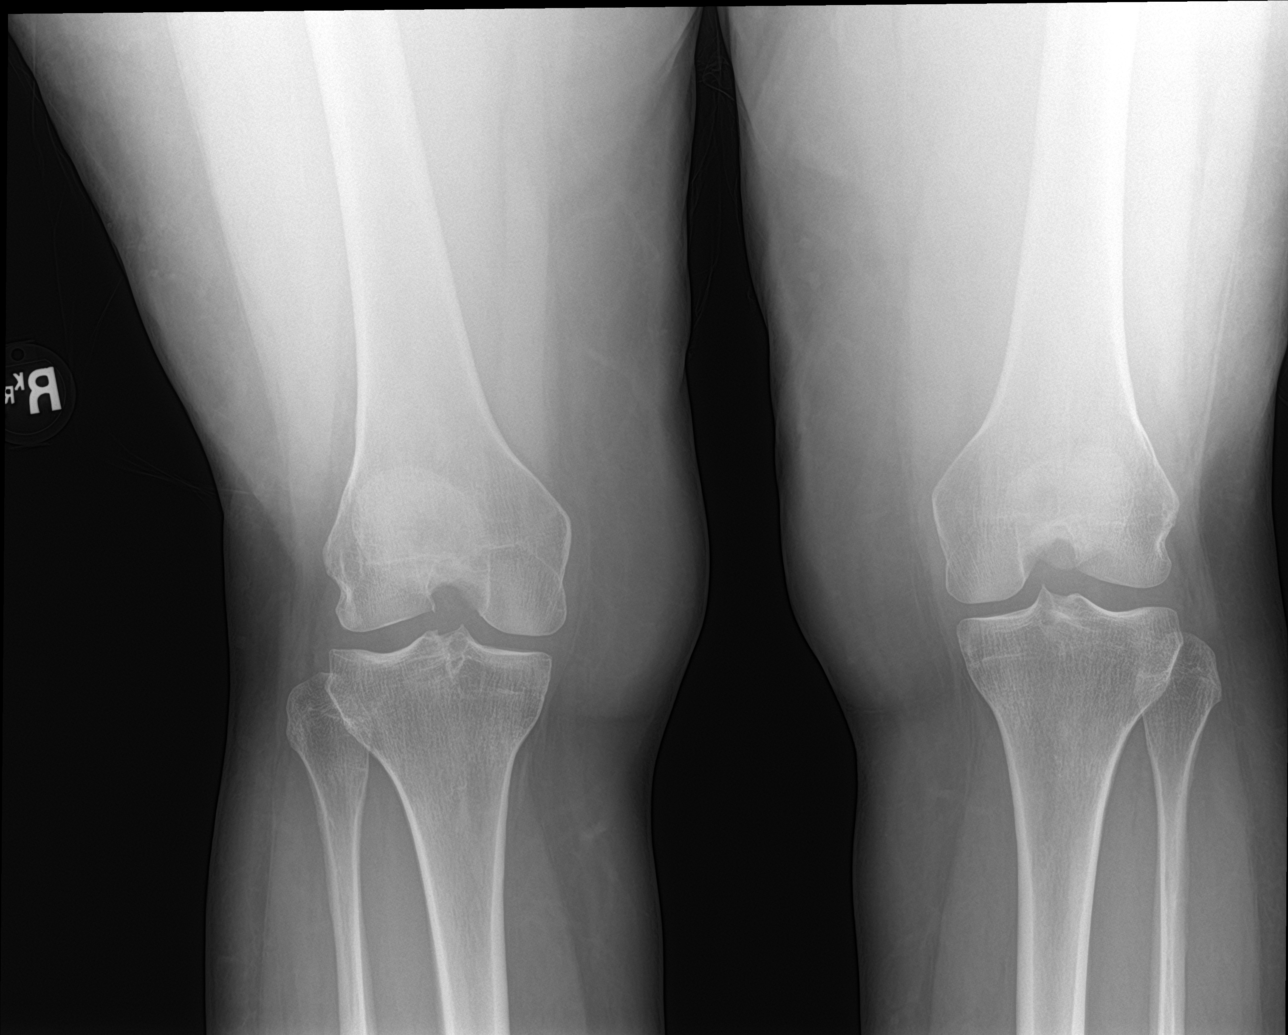

[knee ap bilat standing (2 of 2)]
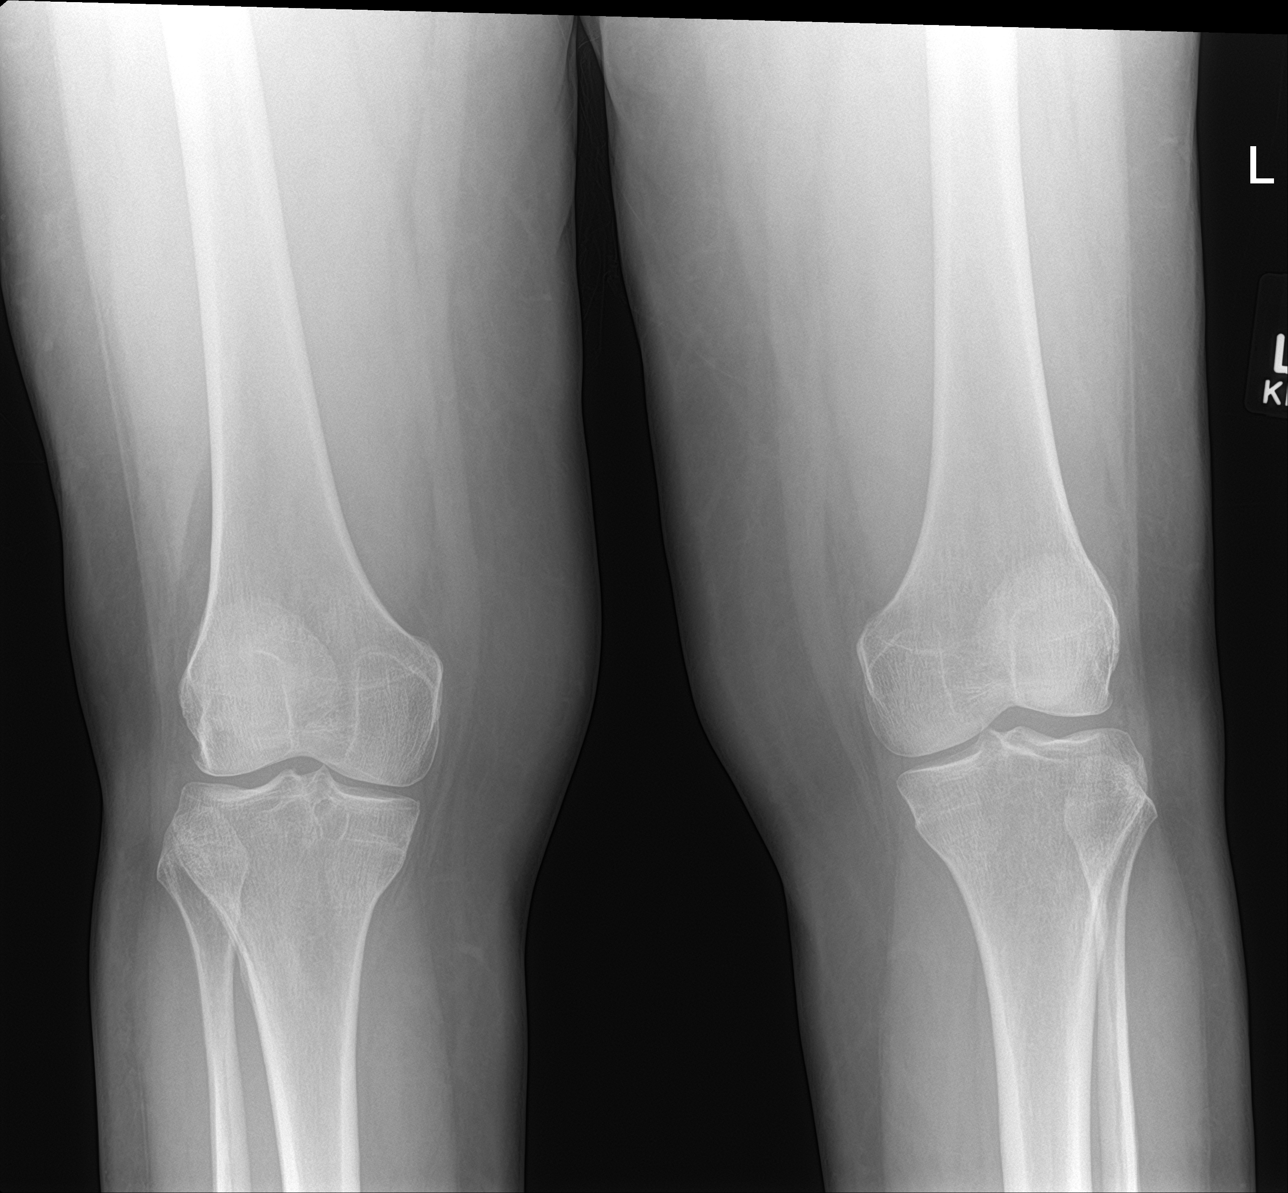

[4 of 4 positions shown; findings below may reference images not displayed]

FINDINGS: Standing views the knees show relatively well preserved knee joint
spaces. No fracture is seen. A small amount of fluid in the left
knee joint space cannot be excluded. The left patella is normally
positioned.
IMPRESSION: No fracture. No significant degenerative change. Cannot exclude a
small amount of left knee joint fluid.

## 2016-04-20 MED ORDER — TRAMADOL HCL 50 MG PO TABS: 50.0000 mg | ORAL_TABLET | Freq: Three times a day (TID) | ORAL | 0 refills | Status: DC | PRN

## 2016-04-20 NOTE — Patient Instructions (Signed)
Thank you for coming in today. Recheck in 1-2 weeks.  Return sooner if needed.   Use crutches for pain as needed.  Call or go to the ER if you develop a large red swollen joint with extreme pain or oozing puss.    Meniscus Tear Introduction A meniscus tear is a knee injury in which a piece of the meniscus is torn. The meniscus is a thick, rubbery, wedge-shaped cartilage in the knee. Two menisci are located in each knee. They sit between the upper bone (femur) and lower bone (tibia) that make up the knee joint. Each meniscus acts as a shock absorber for the knee. A torn meniscus is one of the most common types of knee injuries. This injury can range from mild to severe. Surgery may be needed for a severe tear. What are the causes? This injury may be caused by any squatting, twisting, or pivoting movement. Sports-related injuries are the most common cause. These often occur from:  Running and stopping suddenly.  Changing direction.  Being tackled or knocked off your feet. As people get older, their meniscus gets thinner and weaker. In these people, tears can happen more easily, such as from climbing stairs. What increases the risk? This injury is more likely to happen to:  People who play contact sports.  Males.  People who are 3830?47 years of age. What are the signs or symptoms? Symptoms of this injury include:  Knee pain, especially at the side of the knee joint. You may feel pain when the injury occurs, or you may only hear a pop and feel pain later.  A feeling that your knee is clicking, catching, locking, or giving way.  Not being able to fully bend or extend your knee.  Bruising or swelling in your knee. How is this diagnosed? This injury may be diagnosed based on your symptoms and a physical exam. The physical exam may include:  Moving your knee in different ways.  Feeling for tenderness.  Listening for a clicking sound.  Checking if your knee locks or  catches. You may also have tests, such as:  X-rays.  MRI.  A procedure to look inside your knee with a narrow surgical telescope (arthroscopy). You may be referred to a knee specialist (orthopedic surgeon). How is this treated? Treatment for this injury depends on the severity of the tear. Treatment for a mild tear may include:  Rest.  Medicine to reduce pain and swelling. This is usually a nonsteroidal anti-inflammatory drug (NSAID).  A knee brace or an elastic sleeve or wrap.  Using crutches or a walker to keep weight off your knee and to help you walk.  Exercises to strengthen your knee (physical therapy). You may need surgery if you have a severe tear or if other treatments are not working. Follow these instructions at home: Managing pain and swelling  Take over-the-counter and prescription medicines only as told by your health care provider.  If directed, apply ice to the injured area:  Put ice in a plastic bag.  Place a towel between your skin and the bag.  Leave the ice on for 20 minutes, 2-3 times per day.  Raise (elevate) the injured area above the level of your heart while you are sitting or lying down. Activity  Do not use the injured limb to support your body weight until your health care provider says that you can. Use crutches or a walker as told by your health care provider.  Return to your normal activities  as told by your health care provider. Ask your health care provider what activities are safe for you.  Perform range-of-motion exercises only as told by your health care provider.  Begin doing exercises to strengthen your knee and leg muscles only as told by your health care provider. After you recover, your health care provider may recommend these exercises to help prevent another injury. General instructions  Use a knee brace or elastic wrap as told by your health care provider.  Keep all follow-up visits as told by your health care provider. This  is important. Contact a health care provider if:  You have a fever.  Your knee becomes red, tender, or swollen.  Your pain medicine is not helping.  Your symptoms get worse or do not improve after 2 weeks of home care. This information is not intended to replace advice given to you by your health care provider. Make sure you discuss any questions you have with your health care provider. Document Released: 05/07/2002 Document Revised: 07/23/2015 Document Reviewed: 06/09/2014  2017 Elsevier

## 2016-04-20 NOTE — Progress Notes (Signed)
Subjective:    I'm seeing this patient as a consultation for:  Sunnie NielsenNatalie Alexander, DO  CC: Left knee pain and swelling.   HPI: Patient was seen today by her primary care provider for knee pain and found to have a knee effusion. I was consult to to address the effusion and pain. The patient notes a few days of left knee pain without significant injury. She notes she had been gardening before the pain and swelling started. She has the pain occurs predominantly at the lateral joint line and occurs with stiffness and swelling. She's been using a cane to ambulate which helps some. She denies radiating pain weakness or numbness fevers or chills. She denies locking or catching.  Past medical history, Surgical history, Family history not pertinant except as noted below, Social history, Allergies, and medications have been entered into the medical record, reviewed, and no changes needed.   Review of Systems: No headache, visual changes, nausea, vomiting, diarrhea, constipation, dizziness, abdominal pain, skin rash, fevers, chills, night sweats, weight loss, swollen lymph nodes, body aches, joint swelling, muscle aches, chest pain, shortness of breath, mood changes, visual or auditory hallucinations.   Objective:    Vitals:   04/20/16 1315  BP: 126/71  Pulse: 93   General: Well Developed, well nourished, and in no acute distress.  Neuro/Psych: Alert and oriented x3, extra-ocular muscles intact, able to move all 4 extremities, sensation grossly intact. Skin: Warm and dry, no rashes noted.  Respiratory: Not using accessory muscles, speaking in full sentences, trachea midline.  Cardiovascular: Pulses palpable, no extremity edema. Abdomen: Does not appear distended. MSK: Left knee large effusion. Small abrasion at the patellar tendon without tenderness or exudate. Tender to palpation lateral joint line. Range of motion 5-100. Stable ligamentous exam to anterior posterior drawer testing LCL and MCL  stress testing. Positive lateral McMurray's test. Intact extension and flexion strength.  X-ray left knee reveals no obvious fractures. Minimal DJD. Waiting for radiology review  Limited musculoskeletal ultrasound shows a large effusion  Procedure: Real-time Ultrasound Guided Injection of aspiration and injection  Device: GE Logiq E  Images permanently stored and available for review in the ultrasound unit. Verbal informed consent obtained. Discussed risks and benefits of procedure. Warned about infection bleeding damage to structures skin hypopigmentation and fat atrophy among others. Patient expresses understanding and agreement Time-out conducted.  Noted no overlying erythema, induration, or other signs of local infection.  Skin prepped in a sterile fashion.  Local anesthesia: Topical Ethyl chloride followed by 2 mL subcutaneous wheel of lidocaine With sterile technique and under real time ultrasound guidance: 2-gauge needle was used to access the effusion which was aspirated 20 mL of serous edematous fluid. Syringe was exchanged and 80 mg of Kenalog and 4 mL of Marcaine were  injected easily.  Completed without difficulty  Pain immediately resolved suggesting accurate placement of the medication.  Advised to call if fevers/chills, erythema, induration, drainage, or persistent bleeding.  Images permanently stored and available for review in the ultrasound unit.  Impression: Technically successful ultrasound guided injection.    No results found for this or any previous visit (from the past 24 hour(s)). No results found.  Impression and Recommendations:    Assessment and Plan: 46 y.o. female with left knee pain and effusion without significant trauma or obvious cause on x-ray. I'm suspicious for lateral meniscus injury based on exam. The fluid aspirated today was slightly bloody but did not appear to be frank blood consistent with hemarthrosis.  Plan to send fluid for  cell count differential and culture as well as crystal analysis.  Treat with injection as noted above along with limited weightbearing as guided by pain and tramadol for pain control.   Discussed warning signs or symptoms. Please see discharge instructions. Patient expresses understanding.  Patient was researched in the [place] controlled substance reporting system.  CC: Sunnie Nielsen, DO

## 2016-04-20 NOTE — Progress Notes (Signed)
HPI: Taylor Lloyd is a 46 y.o. female  who presents to [facility] today, 04/20/16,  for chief complaint of:  Chief Complaint  Patient presents with  . Knee Pain    LEFT KNEE SELLING SINCE SUNDAY    Knee pain . Context: woke up with it Sunday, on Saturday she grazed the knee against brick house while doing yard work . Location: L knee, lateral is worse . Quality: swollen, pain with walking . Severity: severe . Duration: 3 days . Timing: constant, worse with walking  Assoc signs/symptoms: small abrasion L knee     Past medical history, surgical history, social history and family history reviewed.  Patient Active Problem List   Diagnosis Date Noted  . Hypertension   . Palpitations 01/28/2016  . Essential hypertension 07/29/2015  . S/P C-section 07/29/2015  . Former smoker 07/29/2015  . S/P tubal ligation 07/29/2015    Current medication list and allergy/intolerance information reviewed.   Current Outpatient Prescriptions on File Prior to Visit  Medication Sig Dispense Refill  . metoprolol succinate (TOPROL XL) 25 MG 24 hr tablet Take 1 tablet (25 mg total) by mouth daily. 90 tablet 3  . Multiple Vitamin (MULTIVITAMIN) tablet Take 1 tablet by mouth daily.     No current facility-administered medications on file prior to visit.    Allergies  Allergen Reactions  . Motrin [Ibuprofen] Hives      Review of Systems:  Constitutional: No recent illness  Musculoskeletal: +new myalgia/arthralgia  Skin: No  Rash//redness, small abrasion as per HPI L knee   Exam:  BP 126/71   Pulse 93   Ht 5\' 3"  (1.6 m)   Wt 166 lb (75.3 kg)   BMI 29.41 kg/m   Constitutional: VS see above. General Appearance: alert, well-developed, well-nourished, NAD  Skin: warm, dry, intact except abrasion on L knee  L Knee exam:   Appearance/alignment appears slightly swollen compared to R, small abrasion of skin over patells  Effusion  present  Lachman's negative for ACL pathology   Posterior drawer negative for PCL pathology  McMurray's positive for meniscus on lateral     ASSESSMENT/PLAN: Appreciate consultation of Dr Denyse Amassorey, [facility] now and will confer w/ Dr Denyse Amassorey re: aspiration/injection once results available.   Acute pain of left knee - Plan: DG Knee 1-2 Views Right, DG Knee Complete 4 Views Left    All questions at time of visit were answered - patient instructed to contact office with any additional concerns. ER/RTC precautions were reviewed with the patient and understanding verbalized.

## 2016-04-21 LAB — SYNOVIAL CELL COUNT + DIFF, W/ CRYSTALS
Basophils, %: 0 %
EOSINOPHILS-SYNOVIAL: 0 % (ref 0–2)
Lymphocytes-Synovial Fld: 19 % (ref 0–74)
Monocyte/Macrophage: 59 % (ref 0–69)
NEUTROPHIL, SYNOVIAL: 22 % (ref 0–24)
Synoviocytes, %: 0 % (ref 0–15)
WBC, SYNOVIAL: 345 {cells}/uL — AB (ref <150)

## 2016-04-25 LAB — BODY FLUID CULTURE
Gram Stain: NONE SEEN
ORGANISM ID, BACTERIA: NO GROWTH

## 2016-05-02 ENCOUNTER — Ambulatory Visit (INDEPENDENT_AMBULATORY_CARE_PROVIDER_SITE_OTHER): Payer: Managed Care, Other (non HMO) | Admitting: Family Medicine

## 2016-05-02 DIAGNOSIS — M25562 Pain in left knee: Secondary | ICD-10-CM | POA: Diagnosis not present

## 2016-05-02 NOTE — Progress Notes (Signed)
   Taylor Lloyd is a 46 y.o. female who presents to [facility] today for follow-up left knee pain. Patient was seen on February 21 for left lateral knee pain. She denies aspiration and injection of left knee effusion. The aspiration showed no crystals or infection. She notes that she's feeling a lot better than she did week ago. She notes continued lateral knee pain but overall feels well with no radiating pain weakness or numbness.   Past Medical History:  Diagnosis Date  . Hypertension   . Palpitations    Past Surgical History:  Procedure Laterality Date  . CESAREAN SECTION     [phone],1997,209,2015  . TUBAL LIGATION     Social History  Substance Use Topics  . Smoking status: Former Games developermoker  . Smokeless tobacco: Never Used  . Alcohol use 0.0 oz/week     Comment: 2 glasses wine per week     ROS:  As above   Medications: Current Outpatient Prescriptions  Medication Sig Dispense Refill  . metoprolol succinate (TOPROL XL) 25 MG 24 hr tablet Take 1 tablet (25 mg total) by mouth daily. 90 tablet 3  . Multiple Vitamin (MULTIVITAMIN) tablet Take 1 tablet by mouth daily.    . traMADol (ULTRAM) 50 MG tablet Take 1 tablet (50 mg total) by mouth every 8 (eight) hours as needed. 15 tablet 0   No current facility-administered medications for this visit.    Allergies  Allergen Reactions  . Motrin [Ibuprofen] Hives     Exam:  BP (!) 141/82   Pulse 73   Wt 164 lb (74.4 kg)   BMI 29.05 kg/m   General: Well Developed, well nourished, and in no acute distress.  Neuro/Psych: Alert and oriented x3, extra-ocular muscles intact, able to move all 4 extremities, sensation grossly intact. Skin: Warm and dry, no rashes noted.  Respiratory: Not using accessory muscles, speaking in full sentences, trachea midline.  Cardiovascular: Pulses palpable, no extremity edema. Abdomen: Does not appear distended. MSK: Left knee bruise present at area of  aspiration of the left lateral knee. No effusion. No erythema. Normal knee motion. Tender palpation lateral joint line.    No results found for this or any previous visit (from the past 48 hour(s)). No results found.    Assessment and Plan: 46 y.o. female with left knee pain: Unclear etiology potential meniscus injury. Plan for watchful waiting if not better proceed with MRI.    No orders of the defined types were placed in this encounter.   Discussed warning signs or symptoms. Please see discharge instructions. Patient expresses understanding.

## 2016-05-02 NOTE — Patient Instructions (Signed)
Thank you for coming in today. Use a compressive knee sleeve.  Apply ice as needed.  Let me know how you are doing in about 1 month.  If not getting better or if getting worse let me know and I will order a MRI.  Please follow up 1-2 days after the MRI.  Try using over the counter aspercream.  Do a test run on you arm for 2 days prior to putting it on your knee.

## 2016-10-26 ENCOUNTER — Encounter: Payer: Self-pay | Admitting: Osteopathic Medicine

## 2017-01-26 ENCOUNTER — Encounter: Payer: Managed Care, Other (non HMO) | Admitting: Osteopathic Medicine

## 2017-01-31 ENCOUNTER — Telehealth: Payer: Self-pay | Admitting: Cardiology

## 2017-01-31 DIAGNOSIS — R072 Precordial pain: Secondary | ICD-10-CM

## 2017-01-31 MED ORDER — METOPROLOL SUCCINATE ER 25 MG PO TB24: 25.0000 mg | ORAL_TABLET | Freq: Every day | ORAL | 0 refills | Status: DC

## 2017-01-31 NOTE — Telephone Encounter (Signed)
Pt was to follow up in June no f/up appt will need to call and schedule an appt for f/u for further refills

## 2017-01-31 NOTE — Telephone Encounter (Signed)
New message      *STAT* If patient is at the pharmacy, call can be transferred to refill team.   1. Which medications need to be refilled? (please list name of each medication and dose if known) metoprolol succinate (TOPROL XL) 25 MG 24 hr tablet  2. Which pharmacy/location (including street and city if local pharmacy) is medication to be sent to? [facility], [place]  3. Do they need a 30 day or 90 day supply? 30

## 2017-02-27 ENCOUNTER — Telehealth: Payer: Self-pay | Admitting: Cardiology

## 2017-02-27 DIAGNOSIS — R072 Precordial pain: Secondary | ICD-10-CM

## 2017-02-27 MED ORDER — METOPROLOL SUCCINATE ER 25 MG PO TB24: 25.0000 mg | ORAL_TABLET | Freq: Every day | ORAL | 0 refills | Status: DC

## 2017-02-27 NOTE — Telephone Encounter (Signed)
New Medication    *STAT* If patient is at the pharmacy, call can be transferred to refill team.   1. Which medications need to be refilled? (please list name of each medication and dose if known) metoprolol succinate (TOPROL XL) 25 MG   2. Which pharmacy/location (including street and city if local pharmacy) is medication to be sent to?[facility] in [place] on [address]   3. Do they need a 30 day or 90 day supply? 90

## 2017-04-19 NOTE — Progress Notes (Signed)
      HPI:  FU palpitations. TSH May 2017 1.37. Echocardiogram December 2017 showed normal LV function and trace mitral regurgitation. Calcium score December 2017 0. Aortic root 4 cm. Alivecor recommended at prior ov. Since last seen,  patient notes elevated heart rate at times.  She denies dyspnea, chest pain or syncope.  Current Outpatient Medications  Medication Sig Dispense Refill  . metoprolol succinate (TOPROL XL) 25 MG 24 hr tablet Take 1 tablet (25 mg total) by mouth daily. KEEP UPCOMING OV. 90 tablet 0  . Multiple Vitamin (MULTIVITAMIN) tablet Take 1 tablet by mouth daily.     No current facility-administered medications for this visit.      Past Medical History:  Diagnosis Date  . Hypertension   . Palpitations     Past Surgical History:  Procedure Laterality Date  . CESAREAN SECTION     [phone],1997,209,2015  . TUBAL LIGATION      Social History   Socioeconomic History  . Marital status: Married    Spouse name: Not on file  . Number of children: 7  . Years of education: Not on file  . Highest education level: Not on file  Social Needs  . Financial resource strain: Not on file  . Food insecurity - worry: Not on file  . Food insecurity - inability: Not on file  . Transportation needs - medical: Not on file  . Transportation needs - non-medical: Not on file  Occupational History  . Not on file  Tobacco Use  . Smoking status: Former Games developermoker  . Smokeless tobacco: Never Used  Substance and Sexual Activity  . Alcohol use: Yes    Alcohol/week: 0.0 oz    Comment: 2 glasses wine per week  . Drug use: Not on file  . Sexual activity: Not on file  Other Topics Concern  . Not on file  Social History Narrative  . Not on file    Family History  Problem Relation Age of Onset  . Hypertension Mother   . Cancer Father        lung  . Hyperlipidemia Father   . Hypertension Father   . CAD Father   . Diabetes Maternal Grandmother   . CAD Brother     ROS: no fevers  or chills, productive cough, hemoptysis, dysphasia, odynophagia, melena, hematochezia, dysuria, hematuria, rash, seizure activity, orthopnea, PND, pedal edema, claudication. Remaining systems are negative.  Physical Exam: Well-developed well-nourished in no acute distress.  Skin is warm and dry.  HEENT is normal.  Neck is supple.  Chest is clear to auscultation with normal expansion.  Cardiovascular exam is regular rate and rhythm.  Abdominal exam nontender or distended. No masses palpated. Extremities show no edema. neuro grossly intact  ECG- NSR, no ST changes; personally reviewed  A/P  1 dilated aortic root-we will arrange follow-up CTA to reassess.  2 palpitations-patient describes elevated heart rate at times.  This is particularly true at night.  Increase Toprol to 50 mg nightly. Previous strips from alivecor showed sinus rhythm.  3 hypertension-blood pressure is controlled.  Continue present medications.  4 tobacco abuse-patient has discontinued.  Olga MillersBrian Crenshaw, MD

## 2017-04-26 ENCOUNTER — Ambulatory Visit: Payer: No Typology Code available for payment source | Admitting: Cardiology

## 2017-04-26 ENCOUNTER — Ambulatory Visit (HOSPITAL_BASED_OUTPATIENT_CLINIC_OR_DEPARTMENT_OTHER)
Admission: RE | Admit: 2017-04-26 | Discharge: 2017-04-26 | Disposition: A | Discharge status: Home / Self Care | Payer: No Typology Code available for payment source | Source: Ambulatory Visit | Attending: Cardiology | Admitting: Cardiology

## 2017-04-26 ENCOUNTER — Encounter: Payer: Self-pay | Admitting: Cardiology

## 2017-04-26 ENCOUNTER — Encounter (HOSPITAL_BASED_OUTPATIENT_CLINIC_OR_DEPARTMENT_OTHER): Payer: Self-pay

## 2017-04-26 VITALS — BP 136/90 | HR 73 | Ht 63.0 in | Wt 160.0 lb

## 2017-04-26 DIAGNOSIS — R002 Palpitations: Secondary | ICD-10-CM

## 2017-04-26 DIAGNOSIS — I712 Thoracic aortic aneurysm, without rupture, unspecified: Secondary | ICD-10-CM

## 2017-04-26 DIAGNOSIS — K760 Fatty (change of) liver, not elsewhere classified: Secondary | ICD-10-CM | POA: Insufficient documentation

## 2017-04-26 DIAGNOSIS — R072 Precordial pain: Secondary | ICD-10-CM

## 2017-04-26 DIAGNOSIS — I10 Essential (primary) hypertension: Secondary | ICD-10-CM | POA: Diagnosis not present

## 2017-04-26 IMAGING — CT CT ANGIO CHEST
2 of 6 series · 19 of 46 positions shown · IV contrast (APPLIED)
Comparison: Coronary artery calcium scoring CT dated [DATE].

CLINICAL DATA: Followup mildly dilated ascending thoracic aorta.

EXAM:
CT ANGIOGRAPHY CHEST WITH CONTRAST
TECHNIQUE: Multidetector CT imaging of the chest was performed using the
standard protocol during bolus administration of intravenous
contrast. Multiplanar CT image reconstructions and MIPs were
obtained to evaluate the vascular anatomy.
CONTRAST:  100mL [RZ] IOPAMIDOL ([RZ]) INJECTION 76%

[Series 4: axial arterial · axial · arterial · 0.66mm/px · z∈[-330,-76]mm · 16 of 97 slices shown]
[im 6/97  lung]
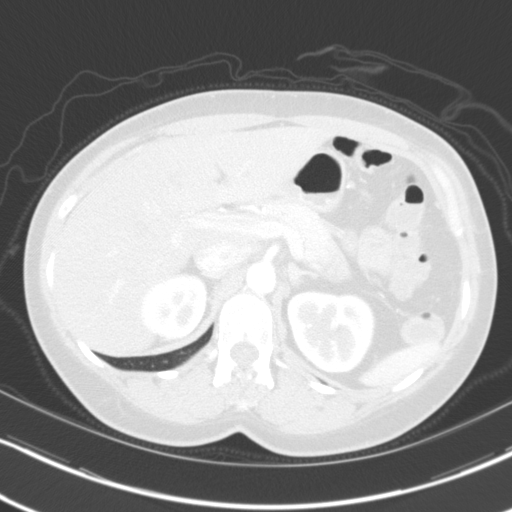
[im 11/97  soft-tissue]
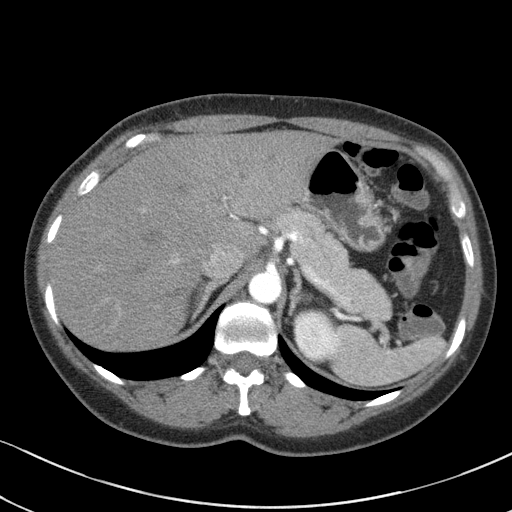
[im 16/97  lung]
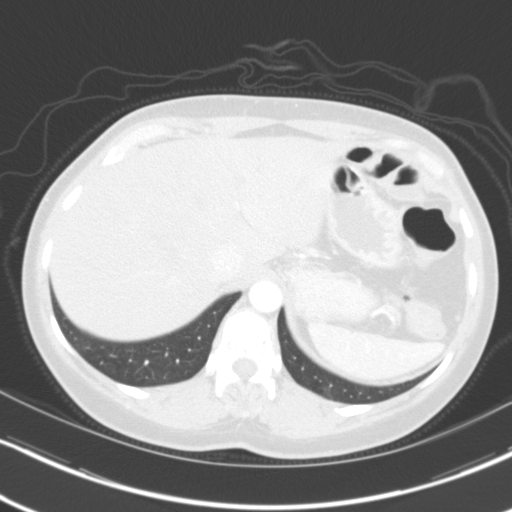
[im 21/97  soft-tissue]
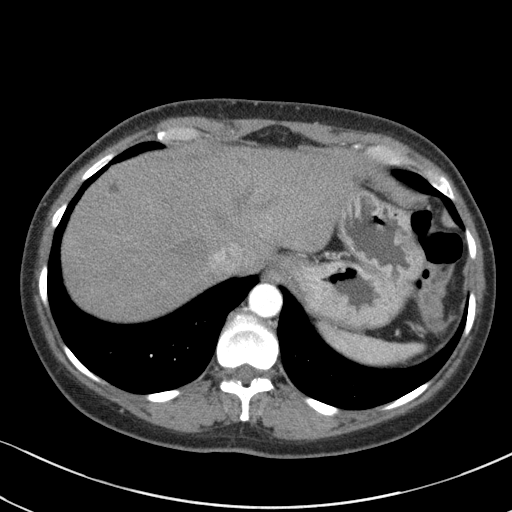
[im 31/97  lung]
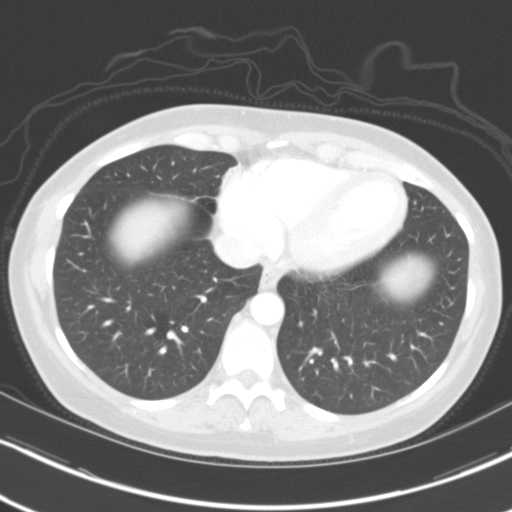
[im 36/97  soft-tissue]
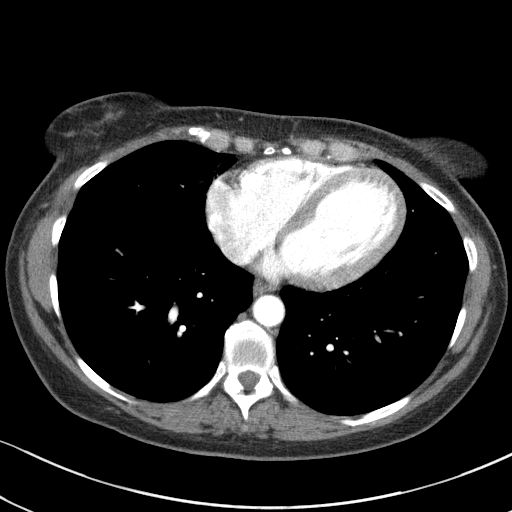
[im 41/97  lung]
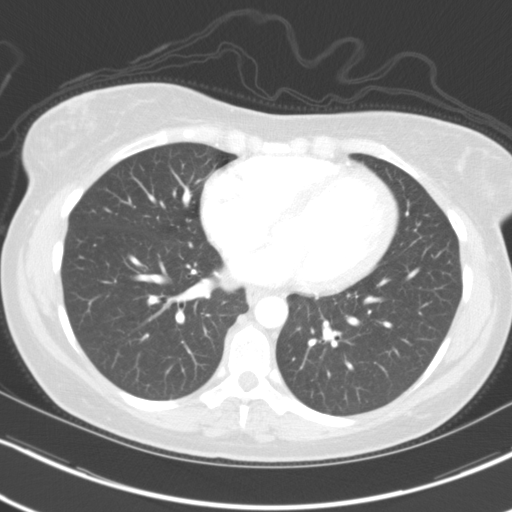
[im 46/97  soft-tissue]
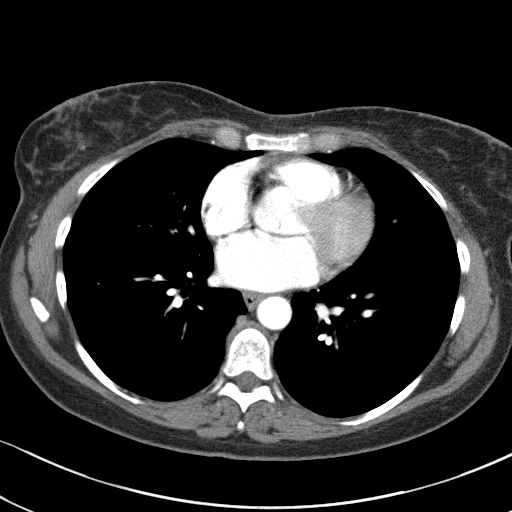
[im 51/97  lung]
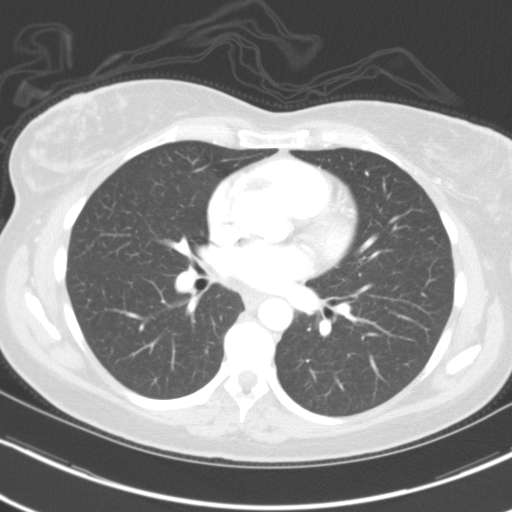
[im 56/97  soft-tissue]
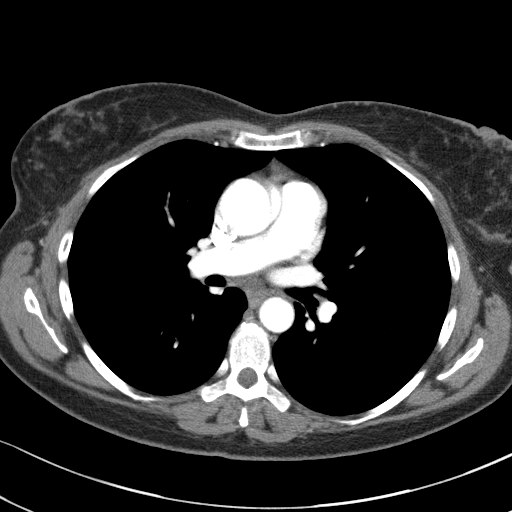
[im 61/97  lung]
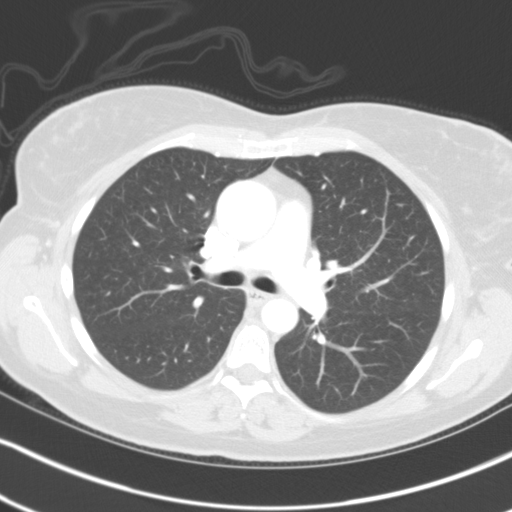
[im 66/97  soft-tissue]
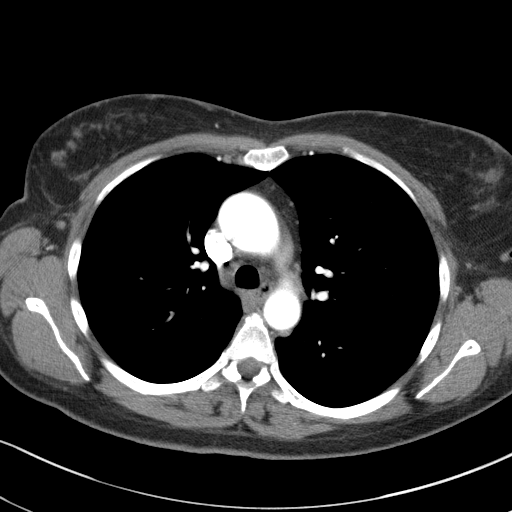
[im 76/97  lung]
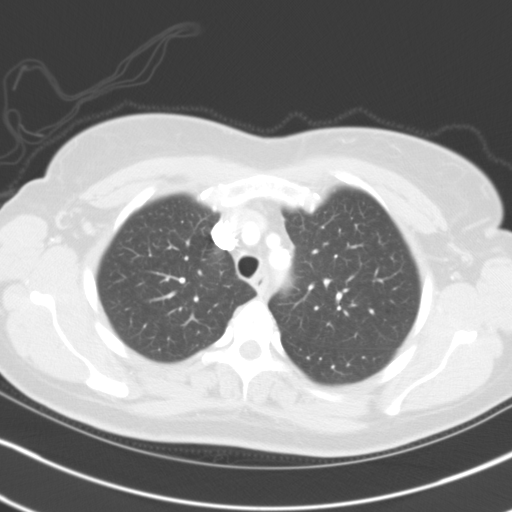
[im 81/97  soft-tissue]
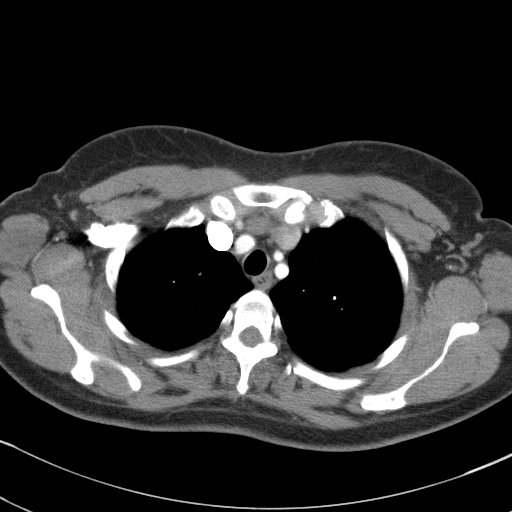
[im 86/97  lung]
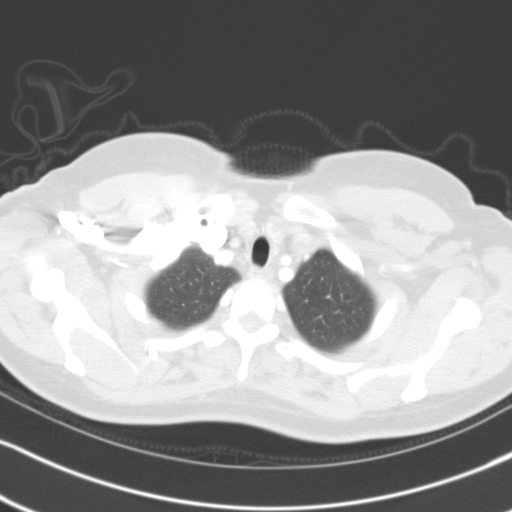
[im 91/97  soft-tissue]
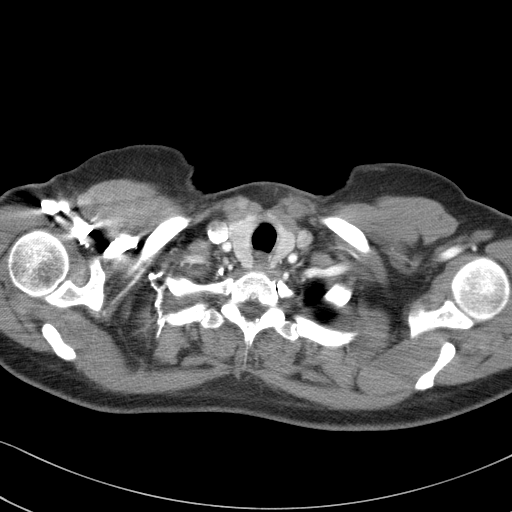

[Series 6: coronal · coronal · 0.57mm/px · 3 of 84 slices shown]
[im 21/84  soft-tissue]
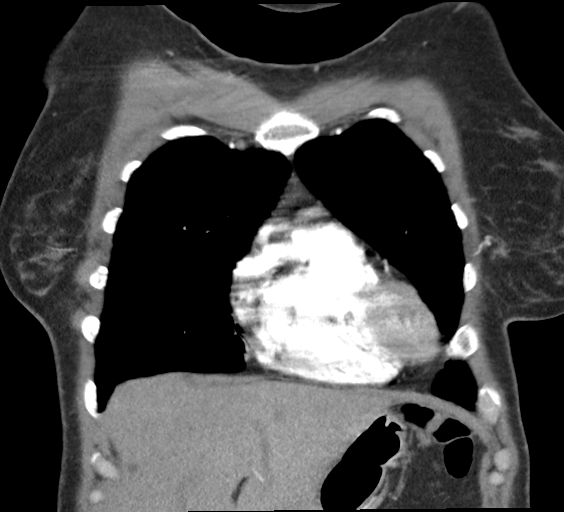
[im 42/84  soft-tissue]
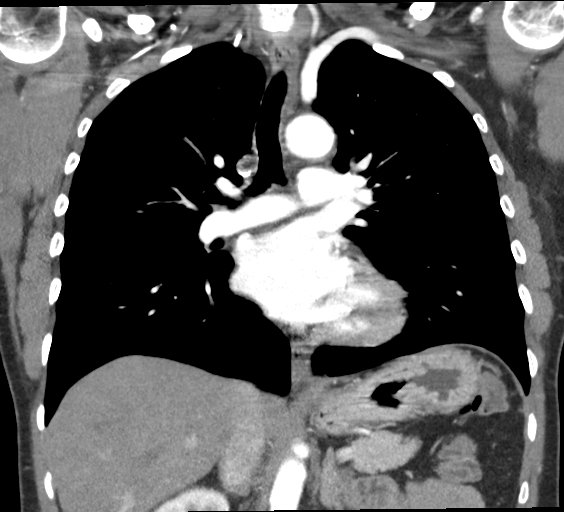
[im 63/84  soft-tissue]
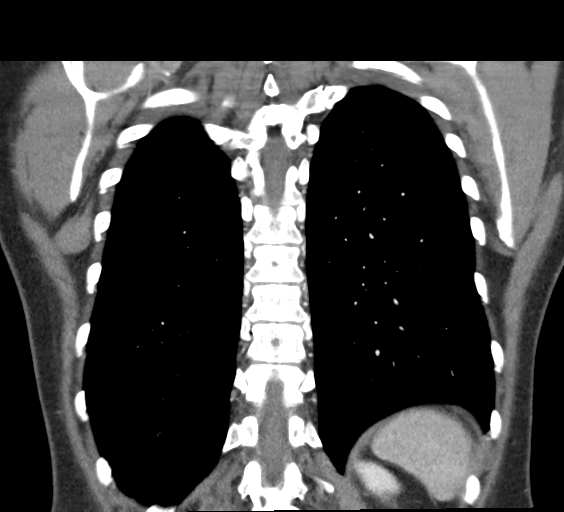

[19 of 46 positions shown; findings below may reference images not displayed]

FINDINGS: Cardiovascular: The ascending thoracic aorta measures 3.8 cm in
maximum diameter on image number 37 of series 4, previously 3.7 cm.
The remainder of the thoracic aorta is normal in caliber and has a
normal appearance. No coronary artery calcifications are visualized.

Mediastinum/Nodes: No enlarged mediastinal, hilar, or axillary lymph
nodes. Thyroid gland, trachea, and esophagus demonstrate no
significant findings.

Lungs/Pleura: Lungs are clear. No pleural effusion or pneumothorax.

Upper Abdomen: Several small liver cysts. Minimal diffuse low
density of the liver relative to the spleen.

Musculoskeletal: Thoracic spine degenerative changes. Lower thoracic
vertebral bone island.

Review of the MIP images confirms the above findings.
IMPRESSION: 1. The ascending thoracic aorta is normal in size, measuring 3.8 cm
in maximum diameter. No further follow-up is necessary.
2. Minimal diffuse hepatic steatosis.

## 2017-04-26 MED ORDER — IOPAMIDOL (ISOVUE-370) INJECTION 76%
100.0000 mL | Freq: Once | INTRAVENOUS | Status: AC | PRN
  Administered 2017-04-26: 100 mL via INTRAVENOUS

## 2017-04-26 MED ORDER — METOPROLOL SUCCINATE ER 50 MG PO TB24: 50.0000 mg | ORAL_TABLET | Freq: Every day | ORAL | 3 refills | Status: DC

## 2017-04-26 NOTE — Patient Instructions (Signed)
Medication Instructions:   INCREASE METOPROLOL TO 50 MG ONCE DAILY= 2 54F THE 25 MG TABLETS ONCE DAILY  Testing/Procedures:  CTA OF THE CHEST W/WO TO FOLLOW UP ON THORACIC ANEURYSM  Follow-Up:  Your physician wants you to follow-up in: ONE YEAR WITH DR Jens SomRENSHAW You will receive a reminder letter in the mail two months in advance. If you don't receive a letter, please call our office to schedule the follow-up appointment.   If you need a refill on your cardiac medications before your next appointment, please call your pharmacy.

## 2018-05-09 ENCOUNTER — Other Ambulatory Visit: Payer: Self-pay

## 2018-05-09 DIAGNOSIS — R072 Precordial pain: Secondary | ICD-10-CM

## 2018-05-09 MED ORDER — METOPROLOL SUCCINATE ER 50 MG PO TB24: 50.0000 mg | ORAL_TABLET | Freq: Every day | ORAL | 0 refills | Status: DC

## 2018-06-11 ENCOUNTER — Other Ambulatory Visit: Payer: Self-pay | Admitting: Cardiology

## 2018-06-11 DIAGNOSIS — R072 Precordial pain: Secondary | ICD-10-CM

## 2018-06-11 NOTE — Telephone Encounter (Signed)
Dr.Crenshaw pt requesting a refill.

## 2018-06-12 MED ORDER — METOPROLOL SUCCINATE ER 50 MG PO TB24: 50.0000 mg | ORAL_TABLET | Freq: Every day | ORAL | 0 refills | Status: DC

## 2018-06-12 NOTE — Telephone Encounter (Signed)
Metoprolol Succ 50 mg refilled.

## 2018-06-13 ENCOUNTER — Telehealth: Payer: Self-pay | Admitting: Cardiology

## 2018-06-13 NOTE — Progress Notes (Signed)
Virtual Visit via Video Note   This visit type was conducted due to national recommendations for restrictions regarding the COVID-19 Pandemic (e.g. social distancing) in an effort to limit this patient's exposure and mitigate transmission in our community.  Due to her co-morbid illnesses, this patient is at least at moderate risk for complications without adequate follow up.  This format is felt to be most appropriate for this patient at this time.  All issues noted in this document were discussed and addressed.  A limited physical exam was performed with this format.  Please refer to the patient's chart for her consent to telehealth for [facility].   Evaluation Performed:  Follow-up visit  Date:  06/14/2018   ID:  [facility], [facility] [birth year], MRN [id number]  Pt location-home Provider location-[place]  PCP:  Sunnie Nielsen, DO  Cardiologist:  Dr Jens Som  Chief Complaint:  Palpitations  History of Present Illness:    FUpalpitations. TSH May 2017 1.37. Echocardiogram December 2017showed normal LV function and trace mitral regurgitation. Calcium score December 2017 0. Aortic root 4 cm. Alivecor recommended at prior ov. FU CTA 2/19 showed ascending aorta 3.8 cm. Since last seen,  The patient does not have symptoms concerning for COVID-19 infection (fever, chills, cough, or new shortness of breath).    Past Medical History:  Diagnosis Date  . Hypertension   . Palpitations    Past Surgical History:  Procedure Laterality Date  . CESAREAN SECTION     [phone]  . TUBAL LIGATION       Current Meds  Medication Sig  . metoprolol succinate (TOPROL-XL) 50 MG 24 hr tablet Take 1 tablet (50 mg total) by mouth daily.  . Multiple Vitamin (MULTIVITAMIN) tablet Take 1 tablet by mouth daily.     Allergies:   Motrin [ibuprofen]   Social History   Tobacco Use  . Smoking status: Former Games developer  . Smokeless tobacco: Never Used  Substance Use Topics  . Alcohol use:  Yes    Alcohol/week: 0.0 standard drinks    Comment: 2 glasses wine per week  . Drug use: Not on file     Family Hx: The patient's family history includes CAD in her brother and father; Cancer in her father; Diabetes in her maternal grandmother; Hyperlipidemia in her father; Hypertension in her father and mother.  ROS:   Please see the history of present illness.    No F/C or productive cough All other systems reviewed and are negative.   Recent Lipid Panel Lab Results  Component Value Date/Time   CHOL 137 07/29/2015 08:15 AM   TRIG 72 07/29/2015 08:15 AM   HDL 68 07/29/2015 08:15 AM   CHOLHDL 2.0 07/29/2015 08:15 AM   LDLCALC 55 07/29/2015 08:15 AM    Wt Readings from Last 3 Encounters:  06/14/18 142 lb (64.4 kg)  04/26/17 160 lb (72.6 kg)  05/02/16 164 lb (74.4 kg)     Objective:    Vital Signs:  BP 129/81   Pulse 77   Ht 5' 2.5" (1.588 m)   Wt 142 lb (64.4 kg)   BMI 25.56 kg/m    Well nourished, well developed female in no acute distress. Remainder of PE not performed (telehealth visit; corona virus pandemic)  ASSESSMENT & PLAN:    1. H/O dilated ascending aorta-will plan fu CTA 2/21. 2. Palpitations-continue beta blocker; symptoms are reasonably well controlled. 3. Hypertension-BP mildly elevated at home per pt with systolic 140; change toprol to 75 mg daily and follow. 4.  Tobacco abuse-pt previously discontinued.  COVID-19 Education: The importance of social distancing was discussed today.  Time:   Today, I have spent 15 minutes with the patient with telehealth technology discussing the above problems.     Medication Adjustments/Labs and Tests Ordered: Current medicines are reviewed at length with the patient today.  Concerns regarding medicines are outlined above.   Tests Ordered: No orders of the defined types were placed in this encounter.   Medication Changes: No orders of the defined types were placed in this encounter.   Disposition:   Follow up 12 months  Signed, Olga MillersBrian Lyda Colcord, MD  06/14/2018 9:27 AM    [facility]

## 2018-06-13 NOTE — Telephone Encounter (Addendum)
LVM for pre reg x3 °

## 2018-06-14 ENCOUNTER — Telehealth (INDEPENDENT_AMBULATORY_CARE_PROVIDER_SITE_OTHER): Payer: No Typology Code available for payment source | Admitting: Cardiology

## 2018-06-14 ENCOUNTER — Encounter: Payer: Self-pay | Admitting: Cardiology

## 2018-06-14 VITALS — BP 129/81 | HR 77 | Ht 62.5 in | Wt 142.0 lb

## 2018-06-14 DIAGNOSIS — I10 Essential (primary) hypertension: Secondary | ICD-10-CM

## 2018-06-14 DIAGNOSIS — R072 Precordial pain: Secondary | ICD-10-CM

## 2018-06-14 DIAGNOSIS — I712 Thoracic aortic aneurysm, without rupture, unspecified: Secondary | ICD-10-CM

## 2018-06-14 DIAGNOSIS — R002 Palpitations: Secondary | ICD-10-CM | POA: Diagnosis not present

## 2018-06-14 MED ORDER — METOPROLOL SUCCINATE ER 25 MG PO TB24: 25.0000 mg | ORAL_TABLET | Freq: Every day | ORAL | 3 refills | Status: DC

## 2018-06-14 MED ORDER — METOPROLOL SUCCINATE ER 50 MG PO TB24: 50.0000 mg | ORAL_TABLET | Freq: Every day | ORAL | 3 refills | Status: DC

## 2018-06-14 NOTE — Patient Instructions (Signed)
Medication Instructions:  INCREASE METOPROLOL TO 75 MG ONCE DAILY= 1 OF THE 50 MG TABLETS AND 1 OF THE 25 MG TABLETS TOGETHER ONCE DAILY If you need a refill on your cardiac medications before your next appointment, please call your pharmacy.   Lab work: If you have labs (blood work) drawn today and your tests are completely normal, you will receive your results only by: Marland Kitchen MyChart Message (if you have MyChart) OR . A paper copy in the mail If you have any lab test that is abnormal or we need to change your treatment, we will call you to review the results.  Follow-Up: At [facility], you and your health needs are our priority.  As part of our continuing mission to provide you with exceptional heart care, we have created designated Provider Care Teams.  These Care Teams include your primary Cardiologist (physician) and Advanced Practice Providers (APPs -  Physician Assistants and Nurse Practitioners) who all work together to provide you with the care you need, when you need it. You will need a follow up appointment in 12 months.  Please call our office 2 months in advance to schedule this appointment.  You may see Olga Millers MD or one of the following Advanced Practice Providers on your designated Care Team:   Corine Shelter, PA-C Judy Pimple, [place] . Marjie Skiff, PA-C

## 2019-01-28 ENCOUNTER — Telehealth: Payer: Self-pay | Admitting: Physician Assistant

## 2019-01-28 DIAGNOSIS — L259 Unspecified contact dermatitis, unspecified cause: Secondary | ICD-10-CM

## 2019-01-28 MED ORDER — DIPHENHYDRAMINE-ZINC ACETATE 2-0.1 % EX CREA: 1.0000 "application " | TOPICAL_CREAM | Freq: Three times a day (TID) | CUTANEOUS | 0 refills | Status: DC | PRN

## 2019-01-28 MED ORDER — PREDNISONE 10 MG PO TABS: ORAL_TABLET | ORAL | 0 refills | Status: DC

## 2019-01-28 NOTE — Progress Notes (Signed)
E Visit for Rash  We are sorry that you are not feeling well. Here is how we plan to help!  Based on what you shared with me it looks like you have contact dermatitis.  Contact dermatitis is a skin rash caused by something that touches the skin and causes irritation or inflammation.  Your skin may be red, swollen, dry, cracked, and itch.  The rash should go away in a few days but can last a few weeks.  If you get a rash, it's important to figure out what caused it so the irritant can be avoided in the future. I have prescribed a course of prednisone and also given you a prescription for benadryl cream to help with the itching. You should continue to use the hydrocortisone cream as well to the affected areas.  HOME CARE:   Take cool showers and avoid direct sunlight.  Apply cool compress or wet dressings.  Take a bath in an oatmeal bath.  Sprinkle content of one Aveeno packet under running faucet with comfortably warm water.  Bathe for 15-20 minutes, 1-2 times daily.  Pat dry with a towel. Do not rub the rash.  Use hydrocortisone cream.  Take an antihistamine like Benadryl for widespread rashes that itch.  The adult dose of Benadryl is 25-50 mg by mouth 4 times daily.  Caution:  This type of medication may cause sleepiness.  Do not drink alcohol, drive, or operate dangerous machinery while taking antihistamines.  Do not take these medications if you have prostate enlargement.  Read package instructions thoroughly on all medications that you take.  GET HELP RIGHT AWAY IF:   Symptoms don't go away after treatment.  Severe itching that persists.  If you rash spreads or swells.  If you rash begins to smell.  If it blisters and opens or develops a yellow-brown crust.  You develop a fever.  You have a sore throat.  You become short of breath.  MAKE SURE YOU:  Understand these instructions. Will watch your condition. Will get help right away if you are not doing well or get  worse.  Thank you for choosing an e-visit. Your e-visit answers were reviewed by a board certified advanced clinical practitioner to complete your personal care plan. Depending upon the condition, your plan could have included both over the counter or prescription medications. Please review your pharmacy choice. Be sure that the pharmacy you have chosen is open so that you can pick up your prescription now.  If there is a problem you may message your provider in [place] to have the prescription routed to another pharmacy. Your safety is important to [place]. If you have drug allergies check your prescription carefully.  For the next 24 hours, you can use MyChart to ask questions about today's visit, request a non-urgent call back, or ask for a work or school excuse from your e-visit provider. You will get an email in the next two days asking about your experience. I hope that your e-visit has been valuable and will speed your recovery.   Greater than 5 minutes, yet less than 10 minutes of time have been spend researching, coordinating, and implementing care for this patient today.

## 2019-05-20 ENCOUNTER — Encounter: Payer: Self-pay | Admitting: Medical-Surgical

## 2019-06-20 ENCOUNTER — Other Ambulatory Visit: Payer: Self-pay

## 2019-06-20 DIAGNOSIS — R072 Precordial pain: Secondary | ICD-10-CM

## 2019-06-20 MED ORDER — METOPROLOL SUCCINATE ER 50 MG PO TB24: 50.0000 mg | ORAL_TABLET | Freq: Every day | ORAL | 3 refills | Status: DC

## 2019-06-26 NOTE — Progress Notes (Signed)
Virtual Visit via Video Note   This visit type was conducted due to national recommendations for restrictions regarding the COVID-19 Pandemic (e.g. social distancing) in an effort to limit this patient's exposure and mitigate transmission in our community.  Due to her co-morbid illnesses, this patient is at least at moderate risk for complications without adequate follow up.  This format is felt to be most appropriate for this patient at this time.  All issues noted in this document were discussed and addressed.  A limited physical exam was performed with this format.  Please refer to the patient's chart for her consent to telehealth for [facility].   Date:  06/28/2019   ID:  [facility], DOB 1970/07/16, MRN [id number]  Patient Location:Home Provider Location: Home  PCP:  Sunnie Nielsen, DO  Cardiologist:  Dr Jens Som  Evaluation Performed:  Follow-Up Visit  Chief Complaint:  FU palpitations  History of Present Illness:    FUpalpitations. TSH May 2017 1.37. Echocardiogram December 2017showed normal LV function and trace mitral regurgitation. Calcium score December 2017 0. Aortic root 4 cm. Alivecor recommended atpriorov. FU CTA 2/19 showed ascending aorta 3.8 cm. Since last seen,the patient denies any dyspnea on exertion, orthopnea, PND, pedal edema, palpitations, syncope or chest pain.   The patient does not have symptoms concerning for COVID-19 infection (fever, chills, cough, or new shortness of breath).    Past Medical History:  Diagnosis Date  . Dilated aortic root (HCC)   . Hypertension   . Palpitations    Past Surgical History:  Procedure Laterality Date  . CESAREAN SECTION     [phone]  . TUBAL LIGATION       Current Meds  Medication Sig  . metoprolol succinate (TOPROL XL) 25 MG 24 hr tablet Take 1 tablet (25 mg total) by mouth daily.  . metoprolol succinate (TOPROL-XL) 50 MG 24 hr tablet Take 1 tablet (50 mg total) by mouth daily.  .  Multiple Vitamin (MULTIVITAMIN) tablet Take 1 tablet by mouth daily.     Allergies:   Motrin [ibuprofen]   Social History   Tobacco Use  . Smoking status: Former Games developer  . Smokeless tobacco: Never Used  Substance Use Topics  . Alcohol use: Yes    Alcohol/week: 0.0 standard drinks    Comment: 2 glasses wine per week  . Drug use: Not on file     Family Hx: The patient's family history includes CAD in her brother and father; Cancer in her father; Diabetes in her maternal grandmother; Hyperlipidemia in her father; Hypertension in her father and mother.  ROS:   Please see the history of present illness.    No Fever, chills  or productive cough All other systems reviewed and are negative.   Recent Lipid Panel Lab Results  Component Value Date/Time   CHOL 137 07/29/2015 08:15 AM   TRIG 72 07/29/2015 08:15 AM   HDL 68 07/29/2015 08:15 AM   CHOLHDL 2.0 07/29/2015 08:15 AM   LDLCALC 55 07/29/2015 08:15 AM    Wt Readings from Last 3 Encounters:  06/28/19 144 lb (65.3 kg)  06/14/18 142 lb (64.4 kg)  04/26/17 160 lb (72.6 kg)     Objective:    Vital Signs:  BP 118/74   Pulse 73   Ht 5\' 3"  (1.6 m)   Wt 144 lb (65.3 kg)   BMI 25.51 kg/m    VITAL SIGNS:  reviewed NAD Answers questions appropriately Normal affect Remainder of physical examination not performed (telehealth visit;  coronavirus pandemic)  ASSESSMENT & PLAN:    1. Dilated ascending aorta-we will arrange follow-up CTA to further assess. 2. Hypertension-blood pressure controlled.  Continue present medications. 3. Palpitations-symptoms are controlled at present.  Continue beta-blocker at present dose.  COVID-19 Education: The importance of social distancing was discussed today.  Time:   Today, I have spent 17 minutes with the patient with telehealth technology discussing the above problems.     Medication Adjustments/Labs and Tests Ordered: Current medicines are reviewed at length with the patient today.   Concerns regarding medicines are outlined above.   Tests Ordered: No orders of the defined types were placed in this encounter.   Medication Changes: No orders of the defined types were placed in this encounter.   Follow Up:  In Person in 1 year(s)  Signed, Kirk Ruths, MD  06/28/2019 7:56 AM    [place]

## 2019-06-28 ENCOUNTER — Telehealth (INDEPENDENT_AMBULATORY_CARE_PROVIDER_SITE_OTHER): Payer: 59 | Admitting: Cardiology

## 2019-06-28 ENCOUNTER — Other Ambulatory Visit: Payer: Self-pay | Admitting: *Deleted

## 2019-06-28 ENCOUNTER — Encounter: Payer: Self-pay | Admitting: Cardiology

## 2019-06-28 VITALS — BP 118/74 | HR 73 | Ht 63.0 in | Wt 144.0 lb

## 2019-06-28 DIAGNOSIS — I712 Thoracic aortic aneurysm, without rupture, unspecified: Secondary | ICD-10-CM

## 2019-06-28 DIAGNOSIS — R002 Palpitations: Secondary | ICD-10-CM | POA: Diagnosis not present

## 2019-06-28 DIAGNOSIS — I10 Essential (primary) hypertension: Secondary | ICD-10-CM | POA: Diagnosis not present

## 2019-06-28 DIAGNOSIS — R072 Precordial pain: Secondary | ICD-10-CM

## 2019-06-28 MED ORDER — METOPROLOL SUCCINATE ER 50 MG PO TB24: 50.0000 mg | ORAL_TABLET | Freq: Every day | ORAL | 3 refills | Status: DC

## 2019-06-28 MED ORDER — METOPROLOL SUCCINATE ER 25 MG PO TB24: 25.0000 mg | ORAL_TABLET | Freq: Every day | ORAL | 3 refills | Status: DC

## 2019-06-28 NOTE — Patient Instructions (Signed)
Medication Instructions:  NO CHANGE *If you need a refill on your cardiac medications before your next appointment, please call your pharmacy*   Lab Work: If you have labs (blood work) drawn today and your tests are completely normal, you will receive your results only by: Marland Kitchen MyChart Message (if you have MyChart) OR . A paper copy in the mail If you have any lab test that is abnormal or we need to change your treatment, we will call you to review the results.   Testing/Procedures: MRA OF THE CHEST TO FOLLOW UP THORACIC ANEURYSM AT [address]= [place] [facility]  Follow-Up: At [facility], you and your health needs are our priority.  As part of our continuing mission to provide you with exceptional heart care, we have created designated Provider Care Teams.  These Care Teams include your primary Cardiologist (physician) and Advanced Practice Providers (APPs -  Physician Assistants and Nurse Practitioners) who all work together to provide you with the care you need, when you need it.  We recommend signing up for the patient portal called "MyChart".  Sign up information is provided on this After Visit Summary.  MyChart is used to connect with patients for Virtual Visits (Telemedicine).  Patients are able to view lab/test results, encounter notes, upcoming appointments, etc.  Non-urgent messages can be sent to your provider as well.   To learn more about what you can do with MyChart, go to [email or web address].    Your next appointment:   12 month(s)  The format for your next appointment:   Either In Person or Virtual  Provider:   You may see Olga Millers MD or one of the following Advanced Practice Providers on your designated Care Team:    Corine Shelter, PA-C  [place], [place]  Edd Fabian, [place]

## 2019-07-26 ENCOUNTER — Other Ambulatory Visit: Payer: Self-pay

## 2019-07-26 ENCOUNTER — Ambulatory Visit
Admission: RE | Admit: 2019-07-26 | Discharge: 2019-07-26 | Disposition: A | Discharge status: Home / Self Care | Payer: 59 | Source: Ambulatory Visit | Attending: Cardiology | Admitting: Cardiology

## 2019-07-26 DIAGNOSIS — I712 Thoracic aortic aneurysm, without rupture, unspecified: Secondary | ICD-10-CM

## 2019-07-26 IMAGING — MR MR MRA CHEST W/ OR W/O CM
11 series · 16 of 16 positions shown · IV contrast (multihance)
Comparison: Chest CT-[DATE]; cardiac CT-[DATE]

CLINICAL DATA: Follow-up thoracic aortic aneurysm

EXAM:
MRA CHEST WITH CONTRAST
TECHNIQUE: Angiographic images of the chest were obtained using MRA technique
with intravenous contrast.
CONTRAST:  13mL MULTIHANCE GADOBENATE DIMEGLUMINE 529 MG/ML IV SOLN

[Series 3: bSSFP · axial · 5.0mm · 0.74mm/px · z∈[-182,+38]mm · 2 of 45 slices shown (1 of 2)]
[im 1/45]
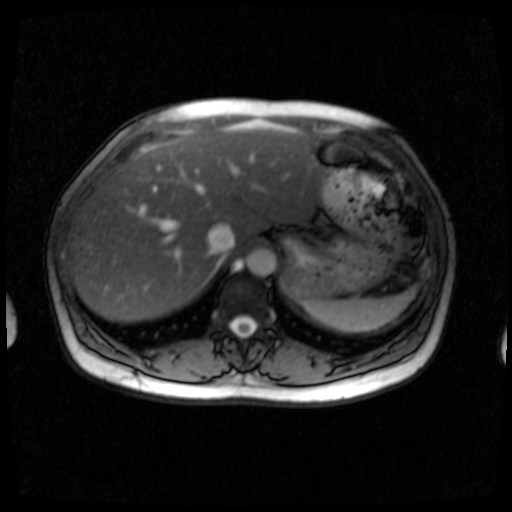
[im 45/45]
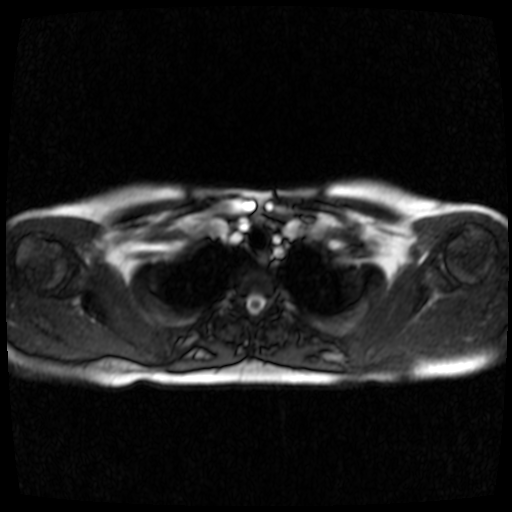

[Series 4: axial flash · axial · 5.0mm · 0.70mm/px · 1 of 45 slices shown]
[im 1/45]
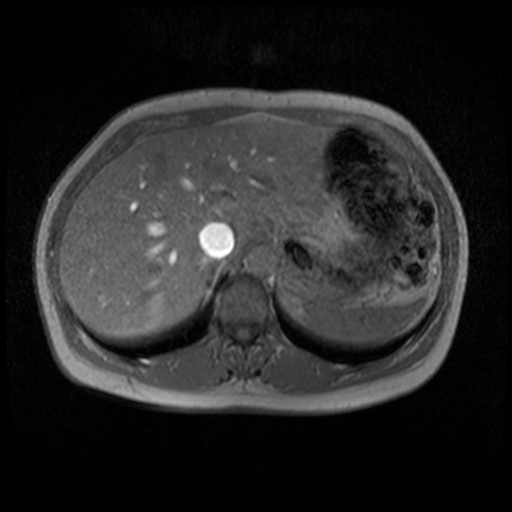

[Series 5: axial haste · axial · 5.0mm · 0.74mm/px · 1 of 45 slices shown]
[im 1/45]
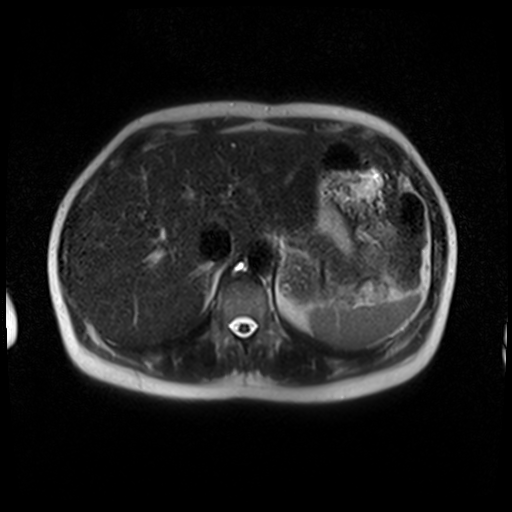

[Series 6: T1 dynamic · axial · non-contrast · 2.5mm · 0.78mm/px · z∈[-181,+37]mm · 2 of 88 slices shown]
[im 1/88]
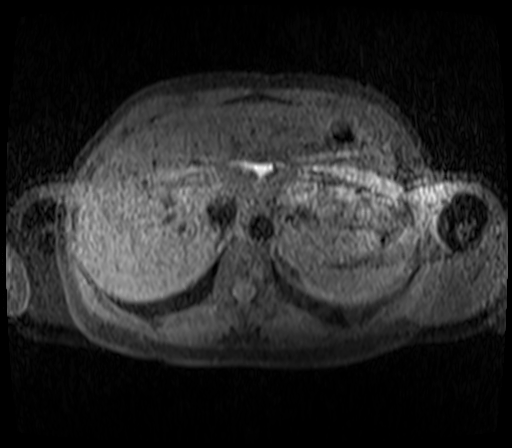
[im 88/88]
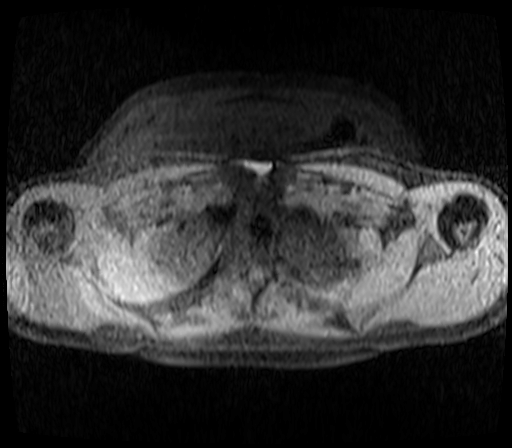

[Series 7: bSSFP · sagittal · 4.0mm · 0.68mm/px · 1 of 30 slices shown (2 of 2)]
[im 1/30]
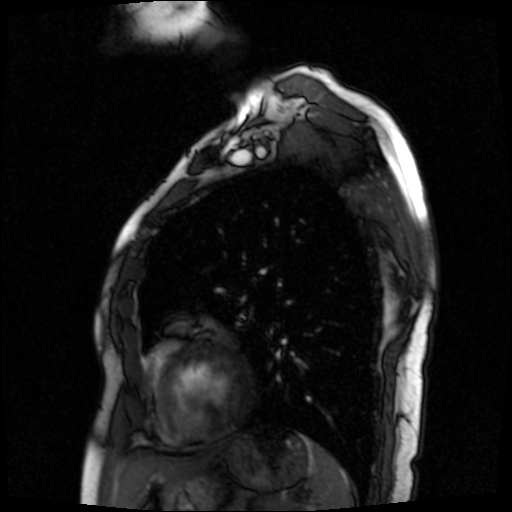

[Series 10: fl3d_cor candy cane_tt=1.0s · sagittal · 1.5mm · 1.12mm/px · 2 of 80 slices shown]
[im 1/80]
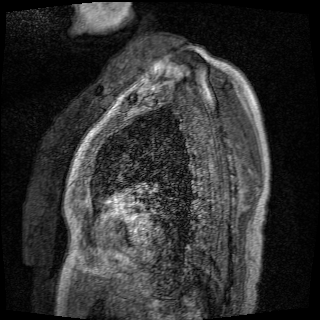
[im 80/80]
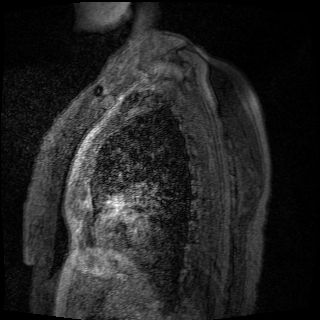

[Series 11: post axial flash · axial · 5.0mm · 0.70mm/px · 1 of 45 slices shown]
[im 1/45]
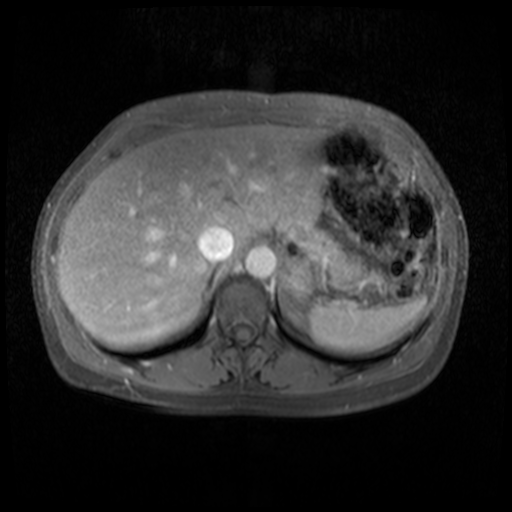

[Series 12: post flash candy · sagittal · 4.0mm · 0.70mm/px · 1 of 30 slices shown]
[im 1/30]
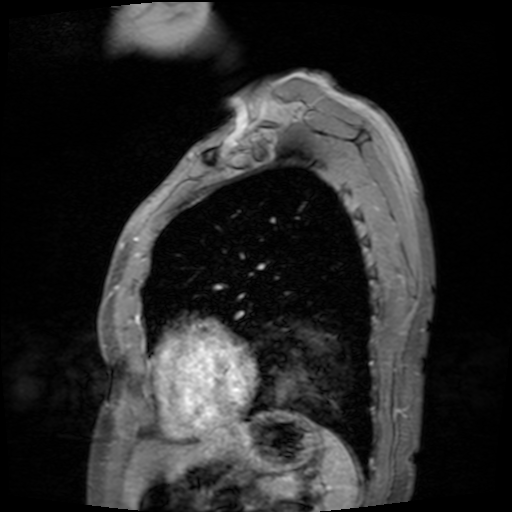

[Series 13: T1 dynamic post-contrast · axial · 2.5mm · 0.78mm/px · z∈[-181,+37]mm · 2 of 88 slices shown]
[im 1/88]
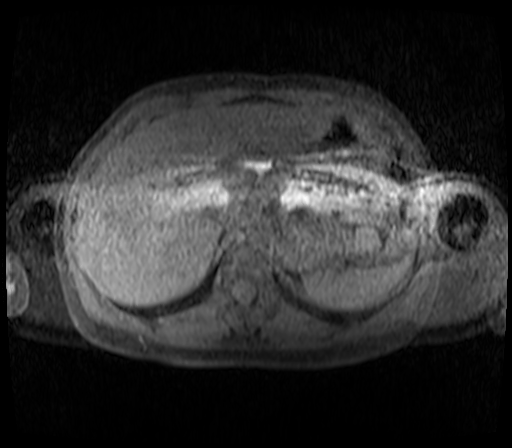
[im 88/88]
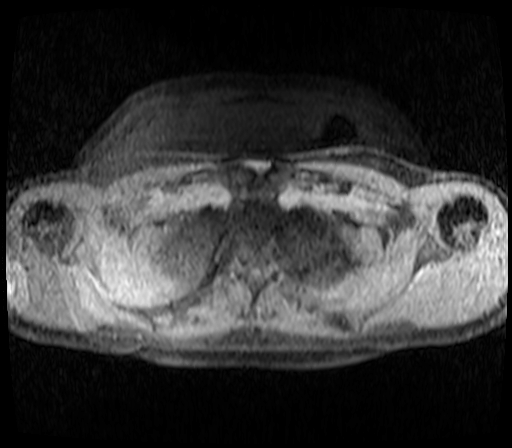

[Series 14: sub_s10-s8_1 · sagittal · 1.5mm · 1.12mm/px · 2 of 80 slices shown]
[im 1/80]
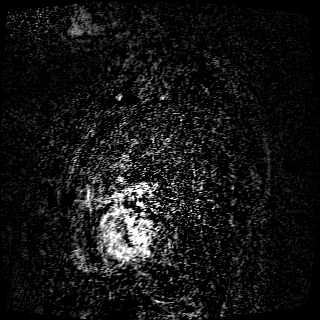
[im 80/80]
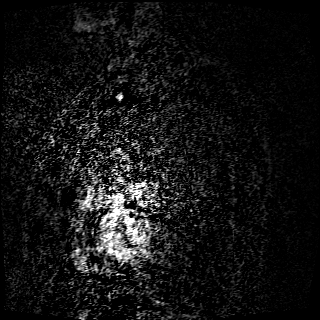

[Series 15: mip l-r · sagittal · 1.12mm/px · 1 of 4 slices shown]
[im 1/4]
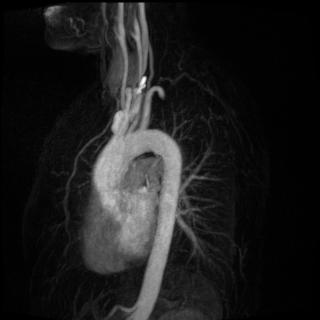

[16 of 16 positions shown; findings below may reference images not displayed]

FINDINGS: Vascular Findings:

Evaluation of the aortic root and ascending thoracic aorta is
degraded secondary to pulsation artifact.

Mild fusiform ectasia of the ascending thoracic aorta with
measurements as follows. No evidence of thoracic aortic dissection
or periaortic stranding on this nongated examination.

Conventional configuration of the aortic arch. The branch vessels of
the aortic arch appear patent throughout their imaged courses.

Normal heart size.  No pericardial effusion.

Although this examination was not tailored for the evaluation the
pulmonary arteries, there are no discrete filling defects within the
central pulmonary arterial tree to suggest central pulmonary
embolism. Normal caliber of the main pulmonary artery.

-------------------------------------------------------------

Thoracic aortic measurements:

Sinotubular junction

35 mm as measured in greatest oblique short axis coronal dimension.

Proximal ascending aorta

39 mm as measured in greatest oblique short axis axial dimension at
the level of the main pulmonary artery (image 17, series 3) and
approximately 38 mm in greatest oblique short axis sagittal diameter
(sagittal image 17, series 7), grossly unchanged compared to the
[DATE] examination, previously, 38 mm.

Aortic arch aorta

29 mm as measured in greatest oblique short axis sagittal dimension.

Proximal descending thoracic aorta

24 mm as measured in greatest oblique short axis axial dimension at
the level of the main pulmonary artery.

Distal descending thoracic aorta

24 mm as measured in greatest oblique short axis axial dimension at
the level of the diaphragmatic hiatus.

Review of the MIP images confirms the above findings.

-------------------------------------------------------------

Non-Vascular Findings:

Mediastinum/Lymph Nodes: No bulky mediastinal, hilar axillary
lymphadenopathy.

Lungs/Pleura: No discrete focal airspace opacities. No pleural
effusion.

Upper abdomen: Punctate (approximately 0.7 cm T2 intense lesion
within the dome of the right lobe of the liver (image 41, series 3),
is too small to accurately characterize, though morphologically
similar to the [DATE] examination, and favored to represent a
hepatic cyst.

Musculoskeletal: No definite acute or aggressive osseous
abnormalities.
IMPRESSION: Stable mild uncomplicated fusiform ectasia of the ascending thoracic
aorta measuring approximately 39 mm in diameter, grossly unchanged
compared to the [DATE] examination.

## 2019-07-26 MED ORDER — GADOBENATE DIMEGLUMINE 529 MG/ML IV SOLN
13.0000 mL | Freq: Once | INTRAVENOUS | Status: AC | PRN
  Administered 2019-07-26: 13 mL via INTRAVENOUS

## 2019-07-30 ENCOUNTER — Other Ambulatory Visit: Payer: Self-pay

## 2019-07-30 ENCOUNTER — Ambulatory Visit (INDEPENDENT_AMBULATORY_CARE_PROVIDER_SITE_OTHER)
Admission: RE | Admit: 2019-07-30 | Discharge: 2019-07-30 | Disposition: A | Discharge status: Home / Self Care | Payer: 59 | Source: Ambulatory Visit

## 2019-07-30 DIAGNOSIS — J0100 Acute maxillary sinusitis, unspecified: Secondary | ICD-10-CM

## 2019-07-30 MED ORDER — AMOXICILLIN 875 MG PO TABS: 875.0000 mg | ORAL_TABLET | Freq: Two times a day (BID) | ORAL | 0 refills | Status: AC

## 2019-07-30 NOTE — Discharge Instructions (Signed)
Take the amoxicillin as directed.  Continue to take Mucinex and Tylenol as needed.    Follow up with your primary care provider or come here to be seen in person if your symptoms are not improving.

## 2019-07-30 NOTE — ED Provider Notes (Signed)
Virtual Visit via Video Note:  Taylor Lloyd  initiated request for Telemedicine visit with [facility] team. I connected with Taylor Lloyd  on 07/30/2019 at 11:08 AM  for a synchronized telemedicine visit using a video enabled HIPPA compliant telemedicine application. I verified that I am speaking with Taylor Lloyd  using two identifiers. Mickie Bail, NP  was physically located in a [facility] site and Hermina Barnard was located at a different location.   The limitations of evaluation and management by telemedicine as well as the availability of in-person appointments were discussed. Patient was informed that she  may incur a bill ( including co-pay) for this virtual visit encounter. Taylor Lloyd  expressed understanding and gave verbal consent to proceed with virtual visit.     History of Present Illness:Taylor Lloyd  is a 49 y.o. female presents for evaluation of 2 week history of cough productive of yellow phlegm, nasal congestion, sinus pressure, maxillary/facial pain.  She denies fever, chills, sore throat, shortness of breath, vomiting, diarrhea, rash, or other symptoms.  Treating at home with Mucinex, Tylenol Sinus, Claritin.  She denies current pregnancy or breastfeeding.      Allergies  Allergen Reactions  . Motrin [Ibuprofen] Hives     Past Medical History:  Diagnosis Date  . Dilated aortic root (HCC)   . Hypertension   . Palpitations      Social History   Tobacco Use  . Smoking status: Former Games developer  . Smokeless tobacco: Never Used  Substance Use Topics  . Alcohol use: Yes    Alcohol/week: 0.0 standard drinks    Comment: 2 glasses wine per week  . Drug use: Not on file   ROS: as stated in HPI.  All other systems reviewed and negative.      Observations/Objective: Physical Exam  VITALS: Patient denies fever. GENERAL: Alert, appears well and in no acute distress. HEENT: Atraumatic.  Patient indicated maxillary sinus pain.    NECK: Normal movements of the head and neck. CARDIOPULMONARY: No increased WOB. Speaking in clear sentences. I:E ratio WNL.  MS: Moves all visible extremities without noticeable abnormality. PSYCH: Pleasant and cooperative, well-groomed. Speech normal rate and rhythm. Affect is appropriate. Insight and judgement are appropriate. Attention is focused, linear, and appropriate.  NEURO: CN grossly intact. Oriented as arrived to appointment on time with no prompting. Moves both UE equally.  SKIN: No obvious lesions, wounds, erythema, or cyanosis noted on face or hands.   Assessment and Plan:    ICD-10-CM   1. Acute non-recurrent maxillary sinusitis  J01.00        Follow Up Instructions: Treating with amoxicillin, Mucinex, Tylenol.  Instructed patient to follow-up with her PCP or come here to be seen in person if her symptoms or not improving.  Patient agrees to plan of care.    I discussed the assessment and treatment plan with the patient. The patient was provided an opportunity to ask questions and all were answered. The patient agreed with the plan and demonstrated an understanding of the instructions.   The patient was advised to call back or seek an in-person evaluation if the symptoms worsen or if the condition fails to improve as anticipated.      Mickie Bail, NP  07/30/2019 11:08 AM         Mickie Bail, NP 07/30/19 1108

## 2020-04-17 ENCOUNTER — Telehealth: Payer: 59 | Admitting: Family Medicine

## 2020-04-17 DIAGNOSIS — R220 Localized swelling, mass and lump, head: Secondary | ICD-10-CM

## 2020-04-17 NOTE — Patient Instructions (Signed)
Recommend applying warm, moist compresses to tender areas. Also, you will need to follow up with your primary care provider for further workup and evaluation of this problem.   Thank you for allowing [place] to be apart of your care.

## 2020-04-17 NOTE — Progress Notes (Signed)
Ms. Taylor Lloyd, stones are scheduled for a virtual visit with your provider today.    Just as we do with appointments in the office, we must obtain your consent to participate.  Your consent will be active for this visit and any virtual visit you may have with one of our providers in the next 365 days.    If you have a MyChart account, I can also send a copy of this consent to you electronically.  All virtual visits are billed to your insurance company just like a traditional visit in the office.  As this is a virtual visit, video technology does not allow for your provider to perform a traditional examination.  This may limit your provider's ability to fully assess your condition.  If your provider identifies any concerns that need to be evaluated in person or the need to arrange testing such as labs, EKG, etc, we will make arrangements to do so.    Although advances in technology are sophisticated, we cannot ensure that it will always work on either your end or our end.  If the connection with a video visit is poor, we may have to switch to a telephone visit.  With either a video or telephone visit, we are not always able to ensure that we have a secure connection.   I need to obtain your verbal consent now.   Are you willing to proceed with your visit today?   Taylor Lloyd has provided verbal consent on 04/17/2020 for a virtual visit (video or telephone). Virtual Visit via Video Note  I connected with Taylor Lloyd on 04/17/20 at  3:00 PM EST by a video enabled telemedicine application and verified that I am speaking with the correct person using two identifiers.  Location: Patient: Home Provider: [place] [facility]   I discussed the limitations of evaluation and management by telemedicine and the availability of in person appointments. The patient expressed understanding and agreed to proceed.  History of Present Illness: Taylor Lloyd is a 50 year old female that presents via  video with complaints of swelling primarily to the base of her scalp.  Patient reports that she has had nodules on each side of scalp for as long as she can remember.  She says that up until this point areas have not been tender to touch.  Upon awakening, patient found the areas have now become tender and swollen.  Patient denies any head injury.  She is not had any scalp manipulation or new hair styles as of late.  Patient denies any headache, blurred vision, dizziness, recent infection, nausea, vomiting, or diarrhea.  She has not been evaluated by her PCP for this problem in the past. Past Medical History:  Diagnosis Date  . Dilated aortic root (HCC)   . Hypertension   . Palpitations    Social History   Socioeconomic History  . Marital status: Married    Spouse name: Not on file  . Number of children: 7  . Years of education: Not on file  . Highest education level: Not on file  Occupational History  . Not on file  Tobacco Use  . Smoking status: Former Games developer  . Smokeless tobacco: Never Used  Substance and Sexual Activity  . Alcohol use: Yes    Alcohol/week: 0.0 standard drinks    Comment: 2 glasses wine per week  . Drug use: Not on file  . Sexual activity: Not on file  Other Topics Concern  . Not on file  Social History  Narrative  . Not on file   Social Determinants of Health   Financial Resource Strain: Not on file  Food Insecurity: Not on file  Transportation Needs: Not on file  Physical Activity: Not on file  Stress: Not on file  Social Connections: Not on file  Intimate Partner Violence: Not on file    There is no immunization history on file for this patient. Allergies  Allergen Reactions  . Motrin [Ibuprofen] Hives   Review of Systems  Constitutional: Negative for chills and fever.  Eyes: Negative.   Respiratory: Negative.   Cardiovascular: Negative.   Gastrointestinal: Negative.   Genitourinary: Negative.   Musculoskeletal: Negative.   Skin:        Nodules to base of scalp  Neurological: Negative.   Psychiatric/Behavioral: Negative.     Assessment and Plan: Nodule of skin of head Patient reports that she has had "knots on the base of her scalp" for as far she can remember.  Recommend applying cool compresses periodically, use interchangeably with warm moist compresses. Also, recommend that patient schedules first available appointment with PCP for further assessment of this problem.  Patient warrants face-to-face visit at this juncture.  Follow Up Instructions:    I discussed the assessment and treatment plan with the patient. The patient was provided an opportunity to ask questions and all were answered. The patient agreed with the plan and demonstrated an understanding of the instructions.   The patient was advised to call back or seek an in-person evaluation if the symptoms worsen or if the condition fails to improve as anticipated.  I provided 5 minutes of non-face-to-face time during this encounter.  Nolon Nations  APRN, MSN, FNP-C [facility] [address] [place], [place] [address] [phone]  04/17/2020  2:50 PM

## 2020-06-15 ENCOUNTER — Telehealth: Payer: Self-pay | Admitting: Cardiology

## 2020-06-15 NOTE — Telephone Encounter (Signed)
4.18.22 LVM on cell phone to schd 1 yr fu w/dr Jens Som.Alinda Dooms

## 2020-06-22 ENCOUNTER — Telehealth: Payer: Self-pay | Admitting: Cardiology

## 2020-06-22 DIAGNOSIS — R072 Precordial pain: Secondary | ICD-10-CM

## 2020-06-22 MED ORDER — METOPROLOL SUCCINATE ER 25 MG PO TB24: 25.0000 mg | ORAL_TABLET | Freq: Every day | ORAL | 0 refills | Status: DC

## 2020-06-22 MED ORDER — METOPROLOL SUCCINATE ER 50 MG PO TB24: 50.0000 mg | ORAL_TABLET | Freq: Every day | ORAL | 0 refills | Status: DC

## 2020-06-22 NOTE — Telephone Encounter (Signed)
*  STAT* If patient is at the pharmacy, call can be transferred to refill team.   1. Which medications need to be refilled? (please list name of each medication and dose if known)  metoprolol succinate (TOPROL-XL) 50 MG 24 hr tablet metoprolol succinate (TOPROL XL) 25 MG 24 hr tablet  2. Which pharmacy/location (including street and city if local pharmacy) is medication to be sent to? [id number] - [place], [place] - [address]  3. Do they need a 30 day or 90 day supply? 90 day   Patient has an appointment 08/05/2020

## 2020-07-22 NOTE — Progress Notes (Signed)
Virtual Visit via Video Note   This visit type was conducted due to national recommendations for restrictions regarding the COVID-19 Pandemic (e.g. social distancing) in an effort to limit this patient's exposure and mitigate transmission in our community.  Due to her co-morbid illnesses, this patient is at least at moderate risk for complications without adequate follow up.  This format is felt to be most appropriate for this patient at this time.  All issues noted in this document were discussed and addressed.  A limited physical exam was performed with this format.  Please refer to the patient's chart for her consent to telehealth for [facility].      Date:  08/05/2020   ID:  Taylor Lloyd, DOB Oct 07, 1970, MRN [id number]  Patient Location:Home Provider Location: Home  PCP:  Sunnie Nielsen, DO  Cardiologist:  Dr Jens Som  Evaluation Performed:  Follow-Up Visit  Chief Complaint:  FU palpitations  History of Present Illness:    FUpalpitations. TSH May 2017 1.37. Echocardiogram December 2017showed normal LV function and trace mitral regurgitation. Calcium score December 2017 0. Aortic root 4 cm. Alivecor recommended atpriorov. 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  Since last seen,the patient denies any dyspnea on exertion, orthopnea, PND, pedal edema, palpitations, syncope or chest pain.   The patient does not have symptoms concerning for COVID-19 infection (fever, chills, cough, or new shortness of breath).    Past Medical History:  Diagnosis Date  . Dilated aortic root (HCC)   . Hypertension   . Palpitations    Past Surgical History:  Procedure Laterality Date  . CESAREAN SECTION     [phone]  . TUBAL LIGATION       Current Meds  Medication Sig  . loratadine (CLARITIN) 10 MG tablet Take 10 mg by mouth daily as needed for allergies.  . metoprolol succinate (TOPROL XL) 25 MG 24  hr tablet Take 1 tablet (25 mg total) by mouth daily.  . metoprolol succinate (TOPROL-XL) 50 MG 24 hr tablet Take 1 tablet (50 mg total) by mouth daily.  . Multiple Vitamin (MULTIVITAMIN) tablet Take 1 tablet by mouth daily.     Allergies:   Motrin [ibuprofen]   Social History   Tobacco Use  . Smoking status: Former Games developer  . Smokeless tobacco: Never Used  Substance Use Topics  . Alcohol use: Yes    Alcohol/week: 0.0 standard drinks    Comment: 2 glasses wine per week     Family Hx: The patient's family history includes CAD in her brother and father; Cancer in her father; Diabetes in her maternal grandmother; Hyperlipidemia in her father; Hypertension in her father and mother.  ROS:   Please see the history of present illness.    No Fever, chills  or productive cough All other systems reviewed and are negative.  Recent Lipid Panel Lab Results  Component Value Date/Time   CHOL 137 07/29/2015 08:15 AM   TRIG 72 07/29/2015 08:15 AM   HDL 68 07/29/2015 08:15 AM   CHOLHDL 2.0 07/29/2015 08:15 AM   LDLCALC 55 07/29/2015 08:15 AM    Wt Readings from Last 3 Encounters:  08/05/20 159 lb (72.1 kg)  06/28/19 144 lb (65.3 kg)  06/14/18 142 lb (64.4 kg)     Objective:    Vital Signs:  BP 122/78   Pulse 74   Ht 5\' 3"  (1.6 m)   Wt 159 lb (72.1 kg)   BMI  28.17 kg/m    VITAL SIGNS:  reviewed NAD Answers questions appropriately Normal affect Remainder of physical examination not performed (telehealth visit; coronavirus pandemic)  ASSESSMENT & PLAN:    1. Palpitations-symptoms are controlled.  Continue beta-blocker at present dose. 2. Hypertension-patient's blood pressure is controlled based on her history.  We will continue present medical regimen. 3. History of dilated ascending aorta-most recent MRA showed ectasia unchanged from 2019.  Will not pursue further imaging at this point.  COVID-19 Education: The importance of social distancing was discussed today.  Time:    Today, I have spent 15 minutes with the patient with telehealth technology discussing the above problems.     Medication Adjustments/Labs and Tests Ordered: Current medicines are reviewed at length with the patient today.  Concerns regarding medicines are outlined above.   Tests Ordered: No orders of the defined types were placed in this encounter.   Medication Changes: No orders of the defined types were placed in this encounter.   Follow Up:  In Person in 1 year(s)  Signed, Olga Millers, MD  08/05/2020 8:31 AM    [facility]

## 2020-08-05 ENCOUNTER — Telehealth (INDEPENDENT_AMBULATORY_CARE_PROVIDER_SITE_OTHER): Payer: 59 | Admitting: Cardiology

## 2020-08-05 ENCOUNTER — Encounter: Payer: Self-pay | Admitting: Cardiology

## 2020-08-05 ENCOUNTER — Encounter: Payer: Self-pay | Admitting: *Deleted

## 2020-08-05 VITALS — BP 122/78 | HR 74 | Ht 63.0 in | Wt 159.0 lb

## 2020-08-05 DIAGNOSIS — R072 Precordial pain: Secondary | ICD-10-CM | POA: Diagnosis not present

## 2020-08-05 DIAGNOSIS — I712 Thoracic aortic aneurysm, without rupture, unspecified: Secondary | ICD-10-CM

## 2020-08-05 DIAGNOSIS — I10 Essential (primary) hypertension: Secondary | ICD-10-CM

## 2020-08-05 MED ORDER — METOPROLOL SUCCINATE ER 25 MG PO TB24: 25.0000 mg | ORAL_TABLET | Freq: Every day | ORAL | 3 refills | Status: DC

## 2020-08-05 MED ORDER — METOPROLOL SUCCINATE ER 50 MG PO TB24: 50.0000 mg | ORAL_TABLET | Freq: Every day | ORAL | 3 refills | Status: DC

## 2020-08-05 NOTE — Patient Instructions (Signed)

## 2020-09-24 ENCOUNTER — Other Ambulatory Visit: Payer: Self-pay | Admitting: Cardiology

## 2020-09-24 DIAGNOSIS — R072 Precordial pain: Secondary | ICD-10-CM

## 2021-03-29 ENCOUNTER — Ambulatory Visit
Admission: EM | Admit: 2021-03-29 | Discharge: 2021-03-29 | Disposition: A | Discharge status: Home / Self Care | Payer: 59 | Attending: Family Medicine | Admitting: Family Medicine

## 2021-03-29 ENCOUNTER — Other Ambulatory Visit: Payer: Self-pay

## 2021-03-29 DIAGNOSIS — J029 Acute pharyngitis, unspecified: Secondary | ICD-10-CM | POA: Diagnosis not present

## 2021-03-29 DIAGNOSIS — H9202 Otalgia, left ear: Secondary | ICD-10-CM

## 2021-03-29 MED ORDER — AMOXICILLIN 875 MG PO TABS: 875.0000 mg | ORAL_TABLET | Freq: Two times a day (BID) | ORAL | 0 refills | Status: AC

## 2021-03-29 NOTE — ED Triage Notes (Signed)
Pt reports having a sore throat for over a week (states she was recently tested for strep and test was negative). Pt states she does having left ear pain, and sinus drainage also.

## 2021-03-29 NOTE — Discharge Instructions (Addendum)
Take amoxicillin 875 mg 1 tab twice daily for 10 days.  If not improving in the next 2-3 days, then you may need testing for mono

## 2021-03-29 NOTE — ED Provider Notes (Signed)
[facility]    CSN: [id number] Arrival date & time: 03/29/21  1019      History   Chief Complaint Chief Complaint  Patient presents with   Sore Throat   Otalgia    HPI Taylor Lloyd is a 51 y.o. female.    Sore Throat  Otalgia Here for a 9-10 day h/o sore throat. [facility]; c/s not done that I could tell in their notes. Temp highest has been 100.0. No n/v/d. Then yesterday her left ear began hurting. No dc from that ear. Does have some postnasal dc, but no cough or nasal congestion.   Past Medical History:  Diagnosis Date   Dilated aortic root (HCC)    Hypertension    Palpitations     Patient Active Problem List   Diagnosis Date Noted   Left knee pain 05/02/2016   Hypertension    Palpitations 01/28/2016   Essential hypertension 07/29/2015   S/P C-section 07/29/2015   Former smoker 07/29/2015   S/P tubal ligation 07/29/2015    Past Surgical History:  Procedure Laterality Date   CESAREAN SECTION     1992,1997,209,2015   TUBAL LIGATION      OB History   No obstetric history on file.      Home Medications    Prior to Admission medications   Medication Sig Start Date End Date Taking? Authorizing Provider  amoxicillin (AMOXIL) 875 MG tablet Take 1 tablet (875 mg total) by mouth 2 (two) times daily for 10 days. 03/29/21 04/08/21 Yes Zenia ResidesBanister, Avryl Roehm K, MD  loratadine (CLARITIN) 10 MG tablet Take 10 mg by mouth daily as needed for allergies.    [provider]  metoprolol succinate (TOPROL XL) 25 MG 24 hr tablet Take 1 tablet (25 mg total) by mouth daily. 08/05/20   Lewayne Buntingrenshaw, Brian S, MD  metoprolol succinate (TOPROL-XL) 50 MG 24 hr tablet TAKE 1 TABLET(50 MG) BY MOUTH DAILY 09/24/20   Lewayne Buntingrenshaw, Brian S, MD  Multiple Vitamin (MULTIVITAMIN) tablet Take 1 tablet by mouth daily.    [provider]    Family History Family History  Problem Relation Age of Onset   Hypertension Mother    Cancer Father         lung   Hyperlipidemia Father    Hypertension Father    CAD Father    Diabetes Maternal Grandmother    CAD Brother     Social History Social History   Tobacco Use   Smoking status: Former   Smokeless tobacco: Never  Substance Use Topics   Alcohol use: Yes    Alcohol/week: 0.0 standard drinks    Comment: 2 glasses wine per week     Allergies   Motrin [ibuprofen]   Review of Systems Review of Systems  HENT:  Positive for ear pain.     Physical Exam Triage Vital Signs ED Triage Vitals  Enc Vitals Group     BP 03/29/21 1030 (!) 149/95     Pulse Rate 03/29/21 1030 75     Resp 03/29/21 1030 20     Temp 03/29/21 1030 98.5 F (36.9 C)     Temp Source 03/29/21 1030 Oral     SpO2 03/29/21 1030 97 %     Weight --      Height --      Head Circumference --      Peak Flow --      Pain Score 03/29/21 1029 8  Pain Loc --      Pain Edu? --      Excl. in GC? --    No data found.  Updated Vital Signs BP (!) 149/95 (BP Location: Left Arm)    Pulse 75    Temp 98.5 F (36.9 C) (Oral)    Resp 20    SpO2 97%   Visual Acuity Right Eye Distance:   Left Eye Distance:   Bilateral Distance:    Right Eye Near:   Left Eye Near:    Bilateral Near:     Physical Exam Vitals reviewed.  Constitutional:      General: She is not in acute distress.    Appearance: She is not toxic-appearing.  HENT:     Right Ear: Tympanic membrane and ear canal normal.     Left Ear: Tympanic membrane and ear canal normal.     Nose: Nose normal.     Mouth/Throat:     Mouth: Mucous membranes are moist.     Pharynx: Posterior oropharyngeal erythema (beefy red posterior OP and tonsillar pillars; no ulcerations) present. No oropharyngeal exudate.  Eyes:     Extraocular Movements: Extraocular movements intact.     Conjunctiva/sclera: Conjunctivae normal.     Pupils: Pupils are equal, round, and reactive to light.  Neck:     Comments: She does have some submandibular  lymphadenopathy Cardiovascular:     Rate and Rhythm: Normal rate and regular rhythm.     Heart sounds: No murmur heard. Pulmonary:     Effort: Pulmonary effort is normal. No respiratory distress.     Breath sounds: No wheezing, rhonchi or rales.  Chest:     Chest wall: No tenderness.  Musculoskeletal:     Cervical back: Neck supple.  Skin:    Capillary Refill: Capillary refill takes less than 2 seconds.     Coloration: Skin is not jaundiced or pale.  Neurological:     General: No focal deficit present.     Mental Status: She is alert and oriented to person, place, and time.  Psychiatric:        Behavior: Behavior normal.     UC Treatments / Results  Labs (all labs ordered are listed, but only abnormal results are displayed) Labs Reviewed - No data to display  EKG   Radiology No results found.  Procedures Procedures (including critical care time)  Medications Ordered in UC Medications - No data to display  Initial Impression / Assessment and Plan / UC Course  I have reviewed the triage vital signs and the nursing notes.  Pertinent labs & imaging results that were available during my care of the patient were reviewed by me and considered in my medical decision making (see chart for details).     Discussed retesting; since she has had a double sickening, and she has had fever, lymphadenopathy, and beefy red throat, will treat with amoxil. Final Clinical Impressions(s) / UC Diagnoses   Final diagnoses:  Acute pharyngitis, unspecified etiology  Left ear pain     Discharge Instructions      Take amoxicillin 875 mg 1 tab twice daily for 10 days.  If not improving in the next 2-3 days, then you may need testing for mono     ED Prescriptions     Medication Sig Dispense Auth. Provider   amoxicillin (AMOXIL) 875 MG tablet Take 1 tablet (875 mg total) by mouth 2 (two) times daily for 10 days. 20 tablet Marlinda Mike, Janace Aris, MD  PDMP not reviewed this  encounter.   Zenia Resides, MD 03/29/21 [phone]

## 2021-06-08 ENCOUNTER — Other Ambulatory Visit: Payer: Self-pay | Admitting: *Deleted

## 2021-06-08 DIAGNOSIS — I7121 Aneurysm of the ascending aorta, without rupture: Secondary | ICD-10-CM

## 2021-06-29 ENCOUNTER — Other Ambulatory Visit: Payer: 59

## 2021-07-13 ENCOUNTER — Encounter: Payer: Self-pay | Admitting: Cardiology

## 2021-07-18 ENCOUNTER — Ambulatory Visit
Admission: RE | Admit: 2021-07-18 | Discharge: 2021-07-18 | Disposition: A | Discharge status: Home / Self Care | Payer: 59 | Source: Ambulatory Visit | Attending: Cardiology | Admitting: Cardiology

## 2021-07-18 DIAGNOSIS — I7121 Aneurysm of the ascending aorta, without rupture: Secondary | ICD-10-CM

## 2021-07-18 IMAGING — MR MR MRA CHEST W/ OR W/O CM
11 series · 16 of 16 positions shown · IV contrast (multihance)
Comparison: Chest MRI-[DATE]; chest CT-[DATE]

CLINICAL DATA: Follow-up thoracic aortic aneurysm.

EXAM:
MRA CHEST WITH CONTRAST
TECHNIQUE: Angiographic images of the chest were obtained using MRA technique
with intravenous contrast.
CONTRAST:  14mL MULTIHANCE GADOBENATE DIMEGLUMINE 529 MG/ML IV SOLN

[Series 2: bSSFP · axial · 5.0mm · 0.74mm/px · 1 of 52 slices shown (1 of 2)]
[im 1/52]
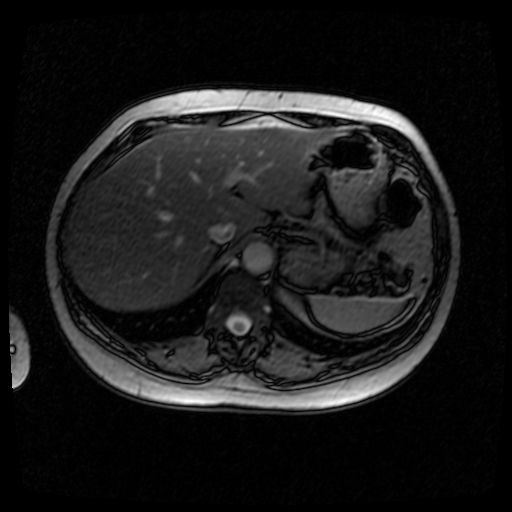

[Series 3: axial flash · axial · 5.0mm · 0.70mm/px · 1 of 45 slices shown]
[im 1/45]
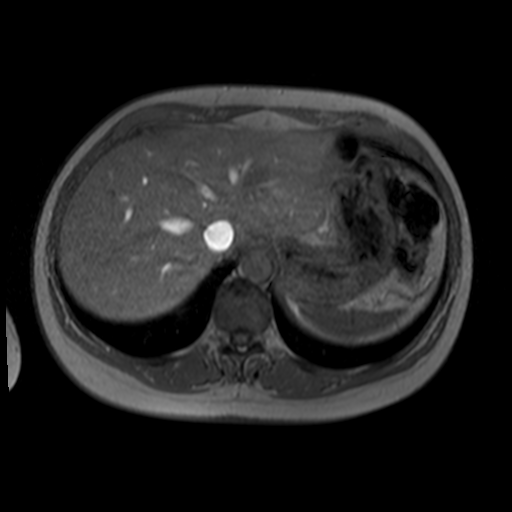

[Series 4: axial haste · axial · 5.0mm · 0.74mm/px · 1 of 45 slices shown]
[im 1/45]
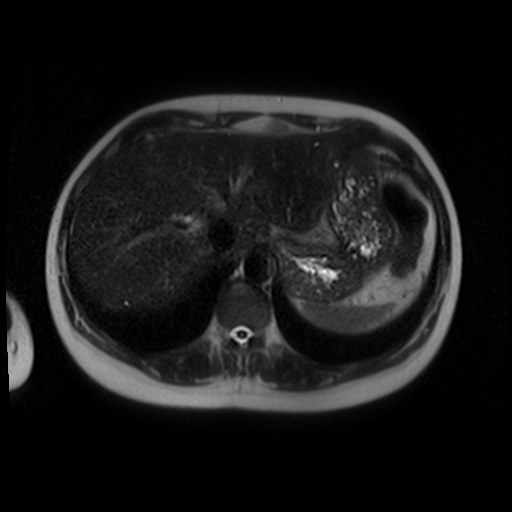

[Series 5: T1 dynamic · axial · non-contrast · 2.5mm · 0.78mm/px · z∈[-135,+102]mm · 2 of 96 slices shown]
[im 1/96]
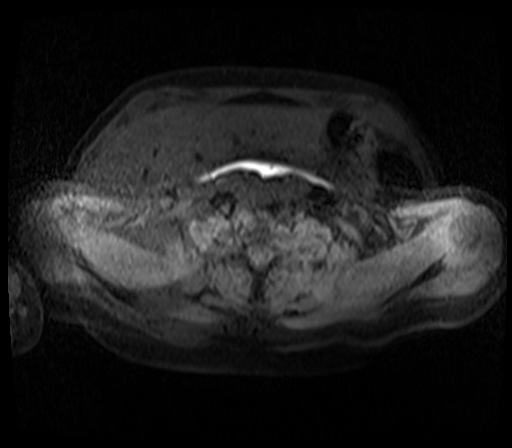
[im 96/96]
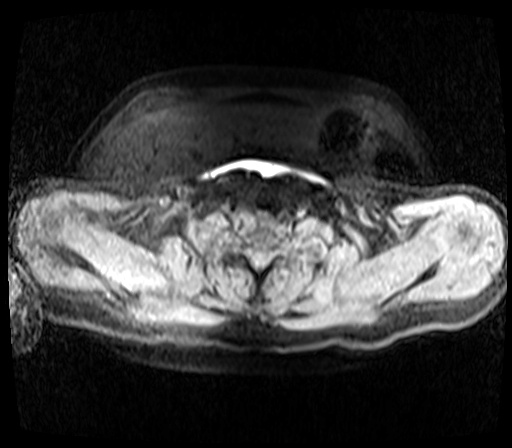

[Series 6: bSSFP · sagittal · 4.0mm · 0.68mm/px · 1 of 32 slices shown (2 of 2)]
[im 1/32]
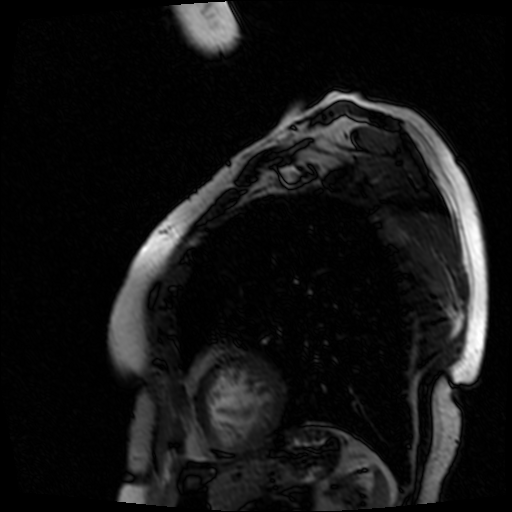

[Series 7: fl3d candy cane_tt=1.0s · sagittal · 1.5mm · 1.12mm/px · 2 of 80 slices shown]
[im 1/80]
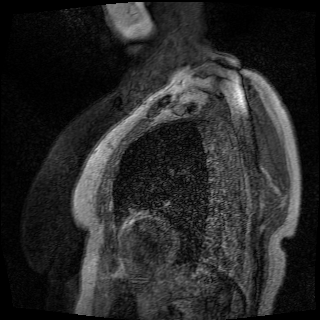
[im 80/80]
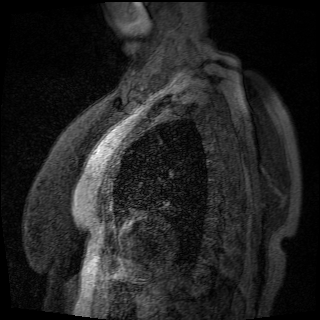

[Series 9: fl3d_cor candy cane_tt=1.0s · sagittal · 1.5mm · 1.12mm/px · 2 of 80 slices shown]
[im 1/80]
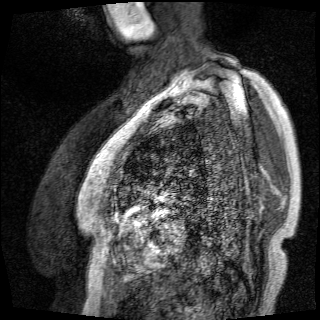
[im 80/80]
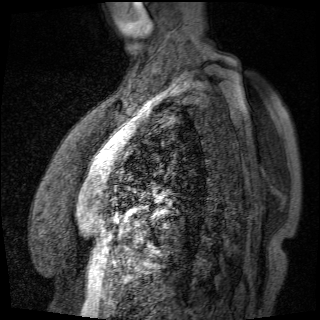

[Series 10: post axial flash · axial · 5.0mm · 0.70mm/px · 1 of 45 slices shown]
[im 1/45]
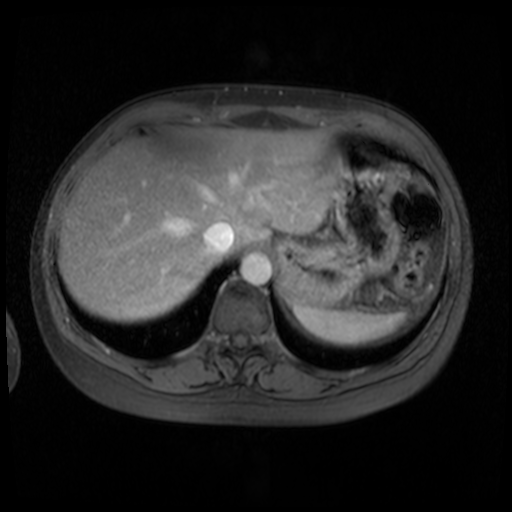

[Series 12: post flash candy · sagittal · 4.0mm · 0.70mm/px · 1 of 30 slices shown]
[im 1/30]
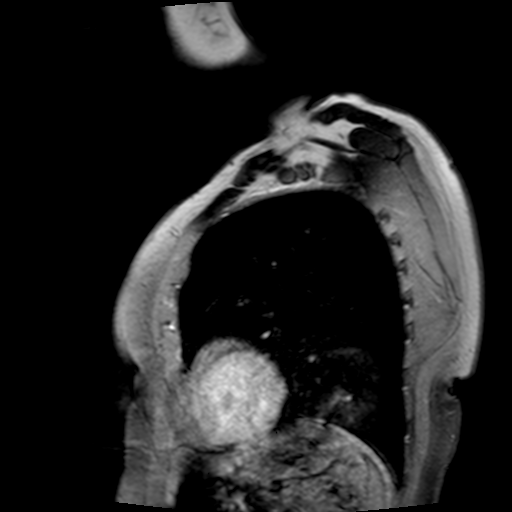

[Series 13: T1 dynamic post-contrast · axial · 2.5mm · 0.78mm/px · z∈[-135,+102]mm · 3 of 96 slices shown]
[im 1/96]
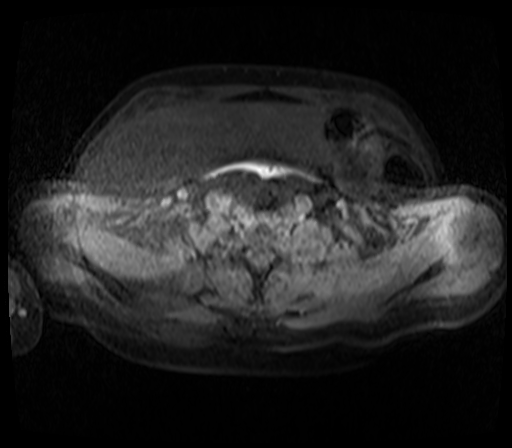
[im 48/96]
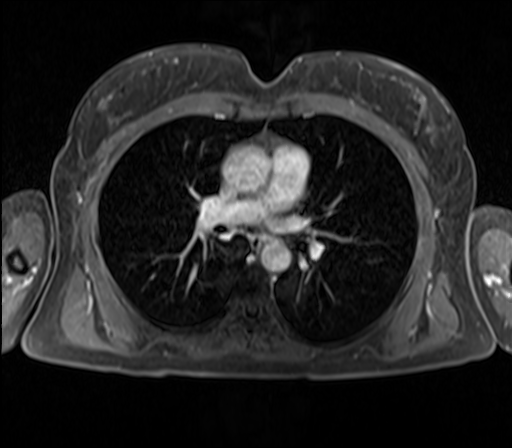
[im 96/96]
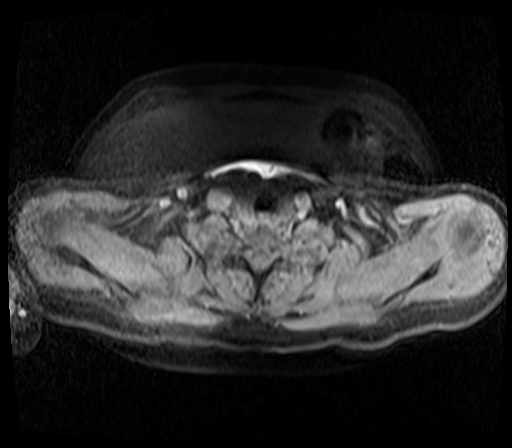

[Series 14: MRA · sagittal · 1.12mm/px · 1 of 3 slices shown]
[im 1/3]
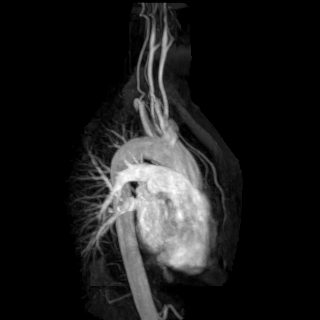

[16 of 16 positions shown; findings below may reference images not displayed]

FINDINGS: Vascular Findings:

Stable fusiform ectasia of the ascending thoracic aorta with
measurements as follows. The thoracic aorta tapers to a normal
caliber at the level of the aortic arch. Descending thoracic aorta
is of normal caliber and widely patent without a hemodynamically
significant stenosis.

No evidence of thoracic aortic dissection or perivascular stranding
on this nongated examination.

Conventional configuration of the aortic arch. The branch vessels of
the aortic arch appear widely patent throughout their imaged courses

Normal heart size.  No pericardial effusion.

Although this examination was not tailored for the evaluation the
pulmonary arteries, there are no discrete filling defects within the
central pulmonary arterial tree to suggest central pulmonary
embolism. Normal caliber of the main artery.

-------------------------------------------------------------

Thoracic aortic measurements:

SINOTUBULAR JUNCTION: 37 mm as measured in greatest oblique short
axis coronal dimension.

PROXIMAL ASCENDING THORACIC AORTA: 39 mm as measured in greatest
oblique short axis axial dimension (axial image 44, series 13) at
the level of the main pulmonary artery and approximately 39 mm as
measured in greatest oblique short axis coronal dimension (sagittal
image 33, series 9), grossly unchanged compared to the [DATE]
examination

AORTIC ARCH: 30 mm as measured in greatest oblique short axis
sagittal dimension.

PROXIMAL DESCENDING THORACIC AORTA: 22 mm as measured in greatest
oblique short axis axial dimension at the level of the main
pulmonary artery.

DISTAL DESCENDING THORACIC AORTA: 21 mm as measured in greatest
oblique short axis axial dimension at the level of the diaphragmatic
hiatus.

Review of the MIP images confirms the above findings.

-------------------------------------------------------------

Non-Vascular Findings:

Mediastinum/Lymph Nodes: No bulky mediastinal, hilar or axillary
lymphadenopathy.

Lungs/Pleura: No discrete focal airspace opacities. No pleural
effusion.

Upper abdomen: Punctate (0.8 cm) T2 intense nonenhancing lesion
within the anterior segment of the right lobe of the liver (image
89, series 13), too small to accurately characterize though
morphologically similar to the [DATE] examination and favored to
represent a hepatic cyst.

Musculoskeletal: No acute or aggressive osseous abnormalities.
Regional soft tissues appear.
IMPRESSION: Stable uncomplicated mild fusiform ectasia of the ascending thoracic
aorta measuring 39 mm diameter, unchanged compared to the [DATE]
examination.

## 2021-07-18 MED ORDER — GADOBENATE DIMEGLUMINE 529 MG/ML IV SOLN
14.0000 mL | Freq: Once | INTRAVENOUS | Status: AC | PRN
  Administered 2021-07-18: 14 mL via INTRAVENOUS

## 2021-07-21 ENCOUNTER — Encounter: Payer: Self-pay | Admitting: *Deleted

## 2021-08-02 ENCOUNTER — Other Ambulatory Visit: Payer: Self-pay | Admitting: Cardiology

## 2021-08-02 DIAGNOSIS — R072 Precordial pain: Secondary | ICD-10-CM

## 2021-08-04 ENCOUNTER — Ambulatory Visit: Payer: 59 | Admitting: Physician Assistant

## 2021-08-07 NOTE — Progress Notes (Signed)
Cardiology Office Note:    Date:  08/09/2021   ID:  Mayla Biddy, DOB May 11, 1970, MRN [id number]  PCP:  Sunnie Nielsen, DO   [facility] Providers Cardiologist:  Olga Millers, MD { Referring MD: Sunnie Nielsen, DO   Chief Complaint  Patient presents with   Follow-up    HTN, palpitations    History of Present Illness:    Taylor Lloyd is a 51 y.o. female with a hx of aortic root dilation of 4 cm. She also has a history of hypertension and palpitations.  Calcium score 01/2016 was 0.  Echocardiogram 2017 with normal LVEF and no significant valvular disease.  Her last three cardiology evaluations were completed through telemedicine. Today, she presents for in-office follow up and medication refills.   MRA to evaluate thoracic aortic dilatation completed 06/2021 was unchanged. Recommendation for follow up study in 3 years (06/2024).   She presents today for medication refills.  She unexpectedly lost her husband in Oct and has been under stress moving to [place] in the next 45 days.  She states her blood pressure has been running high at home, generally under 140 systolic.  She continues to have palpitations.  She is doing well on 75 mg Toprol daily she does not currently see a PCP.  Since she is moving to [place], she does not want to start a new medication.  If she calls back she will need to have BMP to start ARB.  She was intolerant to Norvasc in the past due to lower extremity swelling.  She was also intolerant to what sounds like Imdur due to headaches.   Past Medical History:  Diagnosis Date   Dilated aortic root (HCC)    Hypertension    Palpitations     Past Surgical History:  Procedure Laterality Date   CESAREAN SECTION     1992,1997,209,2015   TUBAL LIGATION      Current Medications: Current Meds  Medication Sig   loratadine (CLARITIN) 10 MG tablet Take 10 mg by mouth daily as needed for allergies.   Multiple Vitamin (MULTIVITAMIN) tablet Take 1 tablet by  mouth daily.   [DISCONTINUED] metoprolol succinate (TOPROL XL) 25 MG 24 hr tablet Take 1 tablet (25 mg total) by mouth daily.   [DISCONTINUED] metoprolol succinate (TOPROL-XL) 50 MG 24 hr tablet TAKE 1 TABLET(50 MG) BY MOUTH DAILY     Allergies:   Motrin [ibuprofen]   Social History   Socioeconomic History   Marital status: Married    Spouse name: Not on file   Number of children: 7   Years of education: Not on file   Highest education level: Not on file  Occupational History   Not on file  Tobacco Use   Smoking status: Former   Smokeless tobacco: Never  Substance and Sexual Activity   Alcohol use: Yes    Alcohol/week: 0.0 standard drinks of alcohol    Comment: 2 glasses wine per week   Drug use: Not on file   Sexual activity: Not on file  Other Topics Concern   Not on file  Social History Narrative   Not on file   Social Determinants of Health   Financial Resource Strain: Not on file  Food Insecurity: Not on file  Transportation Needs: Not on file  Physical Activity: Not on file  Stress: Not on file  Social Connections: Not on file     Family History: The patient's family history includes CAD in her brother and father; Cancer in her father;  Diabetes in her maternal grandmother; Hyperlipidemia in her father; Hypertension in her father and mother.  ROS:   Please see the history of present illness.     All other systems reviewed and are negative.  EKGs/Labs/Other Studies Reviewed:    The following studies were reviewed today:  MRA 06/2021: IMPRESSION: Stable uncomplicated mild fusiform ectasia of the ascending thoracic aorta measuring 39 mm diameter, unchanged compared to the 03/2017 examination.   Echo 02/04/2016 Study Conclusions   - Left ventricle: The cavity size was normal. Wall thickness was    normal. Systolic function was normal. The estimated ejection    fraction was in the range of 55% to 60%. Wall motion was normal;    there were no regional  wall motion abnormalities. Left    ventricular diastolic function parameters were normal. Normal    strain pattern.  - Aortic valve: There was no stenosis.  - Mitral valve: There was trivial regurgitation.  - Right ventricle: The cavity size was normal. Systolic function    was normal.  - Pulmonary arteries: No complete TR doppler jet so unable to    estimate PA systolic pressure.  - Systemic veins: IVC measured 2.1 cm with > 50% respirophasic    variation, suggesting RA pressure 8 mmHg.    EKG:  EKG is  ordered today.  The ekg ordered today demonstrates sinus rhythm with HR 68, LVH with repolarization abnormalities  Recent Labs: No results found for requested labs within last 365 days.  Recent Lipid Panel    Component Value Date/Time   CHOL 137 07/29/2015 0815   TRIG 72 07/29/2015 0815   HDL 68 07/29/2015 0815   CHOLHDL 2.0 07/29/2015 0815   VLDL 14 07/29/2015 0815   LDLCALC 55 07/29/2015 0815     Risk Assessment/Calculations:           Physical Exam:    VS:  BP (!) 164/98   Pulse 68   Ht 5\' 3"  (1.6 m)   Wt 166 lb 12.8 oz (75.7 kg)   LMP 07/22/2021   SpO2 97%   BMI 29.55 kg/m     Wt Readings from Last 3 Encounters:  08/09/21 166 lb 12.8 oz (75.7 kg)  08/05/20 159 lb (72.1 kg)  06/28/19 144 lb (65.3 kg)     GEN:  Well nourished, well developed in no acute distress HEENT: Normal NECK: No JVD; No carotid bruits LYMPHATICS: No lymphadenopathy CARDIAC: RRR, no murmurs, rubs, gallops RESPIRATORY:  Clear to auscultation without rales, wheezing or rhonchi  ABDOMEN: Soft, non-tender, non-distended MUSCULOSKELETAL:  No edema; No deformity  SKIN: Warm and dry NEUROLOGIC:  Alert and oriented x 3 PSYCHIATRIC:  Normal affect   ASSESSMENT:    1. Essential hypertension   2. Palpitations   3. Aneurysm of ascending aorta without rupture (HCC)   4. Former smoker    PLAN:    In order of problems listed above:  Thoracic aortic dilation Stable on MRA  06/2021 Continue BB, BP control Repeat study in 3 years Reiterated the importance of blood pressure control for her aortic aneurysm   Hypertension Toprol dosing: 75 mg daily Hypertensive here - related to stress She has not had recent labs, does not follow iwht a PCP and does not wish to start a new medication prior to moving.    Palpitations  Infrequent palpitations., has a 07/2021. No syncope.    Follow up in 1 year if still in the area.  Medication Adjustments/Labs and Tests Ordered: Current medicines are reviewed at length with the patient today.  Concerns regarding medicines are outlined above.  Orders Placed This Encounter  Procedures   EKG 12-Lead   Meds ordered this encounter  Medications   metoprolol succinate (TOPROL XL) 25 MG 24 hr tablet    Sig: Take 1 tablet (25 mg total) by mouth daily.    Dispense:  90 tablet    Refill:  3   metoprolol succinate (TOPROL-XL) 50 MG 24 hr tablet    Sig: TAKE 1 TABLET(50 MG) BY MOUTH DAILY    Dispense:  90 tablet    Refill:  3    Patient Instructions  Medication Instructions:  No Changes *If you need a refill on your cardiac medications before your next appointment, please call your pharmacy*   Lab Work: No Labs If you have labs (blood work) drawn today and your tests are completely normal, you will receive your results only by: MyChart Message (if you have MyChart) OR A paper copy in the mail If you have any lab test that is abnormal or we need to change your treatment, we will call you to review the results.   Testing/Procedures: No Testing   Follow-Up: At [facility], you and your health needs are our priority.  As part of our continuing mission to provide you with exceptional heart care, we have created designated Provider Care Teams.  These Care Teams include your primary Cardiologist (physician) and Advanced Practice Providers (APPs -  Physician Assistants and Nurse Practitioners) who all work  together to provide you with the care you need, when you need it.  We recommend signing up for the patient portal called "MyChart".  Sign up information is provided on this After Visit Summary.  MyChart is used to connect with patients for Virtual Visits (Telemedicine).  Patients are able to view lab/test results, encounter notes, upcoming appointments, etc.  Non-urgent messages can be sent to your provider as well.   To learn more about what you can do with MyChart, go to [email or web address].    Your next appointment:   1 year(s)  The format for your next appointment:   In Person  Provider:   Olga MillersBrian Crenshaw, MD     Other Instructions Monitor Blood Pressure Daily, Blood Pressure Goal Less than 130/80. Limit sodium to less than 1800 mg Daily.  Important Information About Sugar         Signed, Marcelino Dusterngela Nicole Keondrick Dilks, [place]  08/09/2021 2:03 PM    [facility]

## 2021-08-09 ENCOUNTER — Encounter: Payer: Self-pay | Admitting: Physician Assistant

## 2021-08-09 ENCOUNTER — Ambulatory Visit (INDEPENDENT_AMBULATORY_CARE_PROVIDER_SITE_OTHER): Payer: 59 | Admitting: Physician Assistant

## 2021-08-09 VITALS — BP 164/98 | HR 68 | Ht 63.0 in | Wt 166.8 lb

## 2021-08-09 DIAGNOSIS — I10 Essential (primary) hypertension: Secondary | ICD-10-CM

## 2021-08-09 DIAGNOSIS — Z87891 Personal history of nicotine dependence: Secondary | ICD-10-CM

## 2021-08-09 DIAGNOSIS — R002 Palpitations: Secondary | ICD-10-CM | POA: Diagnosis not present

## 2021-08-09 DIAGNOSIS — I7121 Aneurysm of the ascending aorta, without rupture: Secondary | ICD-10-CM | POA: Diagnosis not present

## 2021-08-09 MED ORDER — METOPROLOL SUCCINATE ER 50 MG PO TB24: ORAL_TABLET | ORAL | 3 refills | Status: DC

## 2021-08-09 MED ORDER — METOPROLOL SUCCINATE ER 25 MG PO TB24: 25.0000 mg | ORAL_TABLET | Freq: Every day | ORAL | 3 refills | Status: DC

## 2021-08-09 NOTE — Patient Instructions (Signed)
Medication Instructions:  No Changes *If you need a refill on your cardiac medications before your next appointment, please call your pharmacy*   Lab Work: No Labs If you have labs (blood work) drawn today and your tests are completely normal, you will receive your results only by: MyChart Message (if you have MyChart) OR A paper copy in the mail If you have any lab test that is abnormal or we need to change your treatment, we will call you to review the results.   Testing/Procedures: No Testing   Follow-Up: At [facility], you and your health needs are our priority.  As part of our continuing mission to provide you with exceptional heart care, we have created designated Provider Care Teams.  These Care Teams include your primary Cardiologist (physician) and Advanced Practice Providers (APPs -  Physician Assistants and Nurse Practitioners) who all work together to provide you with the care you need, when you need it.  We recommend signing up for the patient portal called "MyChart".  Sign up information is provided on this After Visit Summary.  MyChart is used to connect with patients for Virtual Visits (Telemedicine).  Patients are able to view lab/test results, encounter notes, upcoming appointments, etc.  Non-urgent messages can be sent to your provider as well.   To learn more about what you can do with MyChart, go to [email or web address].    Your next appointment:   1 year(s)  The format for your next appointment:   In Person  Provider:   Olga Millers, MD     Other Instructions Monitor Blood Pressure Daily, Blood Pressure Goal Less than 130/80. Limit sodium to less than 1800 mg Daily.  Important Information About Sugar

## 2021-08-11 ENCOUNTER — Encounter: Payer: Self-pay | Admitting: Cardiology

## 2021-08-12 ENCOUNTER — Other Ambulatory Visit: Payer: Self-pay

## 2021-08-12 DIAGNOSIS — Z79899 Other long term (current) drug therapy: Secondary | ICD-10-CM

## 2021-08-12 MED ORDER — LOSARTAN POTASSIUM 50 MG PO TABS: 50.0000 mg | ORAL_TABLET | Freq: Every day | ORAL | 3 refills | Status: DC

## 2021-08-17 ENCOUNTER — Other Ambulatory Visit: Payer: Self-pay | Admitting: *Deleted

## 2021-08-17 MED ORDER — AMLODIPINE BESYLATE 5 MG PO TABS
5.0000 mg | ORAL_TABLET | Freq: Every day | ORAL | 3 refills | Status: DC
Start: 2021-08-17 — End: 2022-08-12

## 2022-07-23 ENCOUNTER — Other Ambulatory Visit: Payer: Self-pay | Admitting: Cardiology

## 2022-07-23 DIAGNOSIS — R002 Palpitations: Secondary | ICD-10-CM

## 2022-08-12 ENCOUNTER — Other Ambulatory Visit: Payer: Self-pay | Admitting: *Deleted

## 2022-08-12 MED ORDER — AMLODIPINE BESYLATE 5 MG PO TABS: 5.0000 mg | ORAL_TABLET | Freq: Every day | ORAL | 0 refills | Status: DC

## 2022-08-15 ENCOUNTER — Other Ambulatory Visit: Payer: Self-pay | Admitting: *Deleted

## 2022-08-15 MED ORDER — AMLODIPINE BESYLATE 5 MG PO TABS: 5.0000 mg | ORAL_TABLET | Freq: Every day | ORAL | 0 refills | Status: AC

## 2022-08-22 ENCOUNTER — Other Ambulatory Visit: Payer: Self-pay

## 2022-08-22 MED ORDER — METOPROLOL SUCCINATE ER 25 MG PO TB24: 25.0000 mg | ORAL_TABLET | Freq: Every day | ORAL | 0 refills | Status: DC

## 2022-09-21 ENCOUNTER — Other Ambulatory Visit: Payer: Self-pay | Admitting: Cardiology
